# Patient Record
Sex: Female | Born: 1955 | Hispanic: Yes | Smoking: Current every day smoker
Health system: Southern US, Community
[De-identification: ages and names within clinical notes are randomized; demographics above are authoritative.]

## PROBLEM LIST (undated history)

## (undated) DIAGNOSIS — E559 Vitamin D deficiency, unspecified: Secondary | ICD-10-CM

## (undated) DIAGNOSIS — K219 Gastro-esophageal reflux disease without esophagitis: Secondary | ICD-10-CM

## (undated) DIAGNOSIS — E785 Hyperlipidemia, unspecified: Secondary | ICD-10-CM

## (undated) DIAGNOSIS — Z720 Tobacco use: Secondary | ICD-10-CM

## (undated) DIAGNOSIS — E538 Deficiency of other specified B group vitamins: Secondary | ICD-10-CM

## (undated) DIAGNOSIS — E119 Type 2 diabetes mellitus without complications: Secondary | ICD-10-CM

## (undated) DIAGNOSIS — J45909 Unspecified asthma, uncomplicated: Secondary | ICD-10-CM

## (undated) DIAGNOSIS — M810 Age-related osteoporosis without current pathological fracture: Secondary | ICD-10-CM

## (undated) DIAGNOSIS — D510 Vitamin B12 deficiency anemia due to intrinsic factor deficiency: Secondary | ICD-10-CM

## (undated) DIAGNOSIS — E781 Pure hyperglyceridemia: Secondary | ICD-10-CM

## (undated) DIAGNOSIS — G4730 Sleep apnea, unspecified: Secondary | ICD-10-CM

## (undated) DIAGNOSIS — T8859XA Other complications of anesthesia, initial encounter: Secondary | ICD-10-CM

## (undated) HISTORY — DX: Vitamin D deficiency, unspecified: E55.9

## (undated) HISTORY — DX: Hyperlipidemia, unspecified: E78.5

## (undated) HISTORY — DX: Gastro-esophageal reflux disease without esophagitis: K21.9

## (undated) HISTORY — DX: Deficiency of other specified B group vitamins: E53.8

## (undated) HISTORY — DX: Pure hyperglyceridemia: E78.1

## (undated) HISTORY — PX: LEG SURGERY: SHX1003

## (undated) HISTORY — DX: Tobacco use: Z72.0

## (undated) HISTORY — PX: ESOPHAGOGASTRODUODENOSCOPY: SHX1529

## (undated) HISTORY — DX: Age-related osteoporosis without current pathological fracture: M81.0

## (undated) HISTORY — PX: COLONOSCOPY: SHX174

---

## 2018-06-11 ENCOUNTER — Other Ambulatory Visit: Payer: Self-pay

## 2018-06-11 ENCOUNTER — Emergency Department
Admission: EM | Admit: 2018-06-11 | Discharge: 2018-06-11 | Disposition: A | Discharge status: Home / Self Care | Payer: Medicare Other | Attending: Emergency Medicine | Admitting: Emergency Medicine

## 2018-06-11 DIAGNOSIS — K625 Hemorrhage of anus and rectum: Secondary | ICD-10-CM

## 2018-06-11 DIAGNOSIS — Z760 Encounter for issue of repeat prescription: Secondary | ICD-10-CM | POA: Diagnosis not present

## 2018-06-11 LAB — TYPE AND SCREEN
ABO/RH(D): A POS
Antibody Screen: NEGATIVE

## 2018-06-11 LAB — COMPREHENSIVE METABOLIC PANEL
ALBUMIN: 4 g/dL (ref 3.5–5.0)
ALT: 29 U/L (ref 0–44)
AST: 18 U/L (ref 15–41)
Alkaline Phosphatase: 83 U/L (ref 38–126)
Anion gap: 11 (ref 5–15)
BUN: 12 mg/dL (ref 8–23)
CO2: 25 mmol/L (ref 22–32)
Calcium: 8.9 mg/dL (ref 8.9–10.3)
Chloride: 102 mmol/L (ref 98–111)
Creatinine, Ser: 0.85 mg/dL (ref 0.44–1.00)
GFR calc Af Amer: >60 mL/min (ref >60)
GFR calc non Af Amer: >60 mL/min (ref >60)
GLUCOSE: 224 mg/dL — AB (ref 70–99)
Potassium: 4.3 mmol/L (ref 3.5–5.1)
Sodium: 138 mmol/L (ref 135–145)
Total Bilirubin: 0.7 mg/dL (ref 0.3–1.2)
Total Protein: 7.6 g/dL (ref 6.5–8.1)

## 2018-06-11 LAB — CBC
HCT: 44.7 % (ref 36.0–46.0)
Hemoglobin: 14.5 g/dL (ref 12.0–15.0)
MCH: 29.1 pg (ref 26.0–34.0)
MCHC: 32.4 g/dL (ref 30.0–36.0)
MCV: 89.8 fL (ref 80.0–100.0)
Platelets: 320 10*3/uL (ref 150–400)
RBC: 4.98 MIL/uL (ref 3.87–5.11)
RDW: 13.1 % (ref 11.5–15.5)
WBC: 12.5 10*3/uL — ABNORMAL HIGH (ref 4.0–10.5)
nRBC: 0 % (ref 0.0–0.2)

## 2018-06-11 MED ORDER — ALBUTEROL SULFATE HFA 108 (90 BASE) MCG/ACT IN AERS: 2.0000 | INHALATION_SPRAY | Freq: Four times a day (QID) | RESPIRATORY_TRACT | 2 refills | Status: AC | PRN

## 2018-06-11 MED ORDER — METFORMIN HCL 500 MG PO TABS: 500.0000 mg | ORAL_TABLET | Freq: Two times a day (BID) | ORAL | 11 refills | Status: DC

## 2018-06-11 MED ORDER — ESOMEPRAZOLE MAGNESIUM 40 MG PO CPDR: 40.0000 mg | DELAYED_RELEASE_CAPSULE | Freq: Every day | ORAL | 1 refills | Status: DC

## 2018-06-11 MED ORDER — FLUTICASONE PROPIONATE HFA 44 MCG/ACT IN AERO: 2.0000 | INHALATION_SPRAY | Freq: Two times a day (BID) | RESPIRATORY_TRACT | 2 refills | Status: DC

## 2018-06-11 MED ORDER — HYDROCORTISONE ACETATE 25 MG RE SUPP: 25.0000 mg | Freq: Two times a day (BID) | RECTAL | 1 refills | Status: AC

## 2018-06-11 NOTE — ED Notes (Signed)
Pt discharged home after verbalizing understanding of discharge instructions; nad noted. 

## 2018-06-11 NOTE — ED Provider Notes (Signed)
[facility] Emergency Department Provider Note       Time seen: ----------------------------------------- 7:27 AM on 06/11/2018 -----------------------------------------   I have reviewed the triage vital signs and the nursing notes.  HISTORY   Chief Complaint GI Bleeding    HPI Jessica Weiss is a 62 y.o. female with no significant past medical history although she notes a history of diverticulitis who presents to the ED for rectal bleeding for the past 10 days.  Patient reports there is some blood in the toilet as well as when she wipes.  She describes a burning sensation, denies fevers, chills, vomiting, diarrhea or constipation.  No past medical history on file.  There are no active problems to display for this patient.  Allergies Morphine and related and Sulfur  Social History Social History   Tobacco Use  . Smoking status: Not on file  Substance Use Topics  . Alcohol use: Not on file  . Drug use: Not on file   Review of Systems Constitutional: Negative for fever. Cardiovascular: Negative for chest pain. Respiratory: Negative for shortness of breath. Gastrointestinal: Negative for abdominal pain, positive for rectal bleeding Musculoskeletal: Negative for back pain. Skin: Negative for rash. Neurological: Negative for headaches, focal weakness or numbness.  All systems negative/normal/unremarkable except as stated in the HPI  ____________________________________________   PHYSICAL EXAM:  VITAL SIGNS: ED Triage Vitals [06/11/18 0029]  Enc Vitals Group     BP (!) 148/82     Pulse Rate 98     Resp 18     Temp 98.5 F (36.9 C)     Temp Source Oral     SpO2 93 %     Weight 229 lb (103.9 kg)     Height 5\' 6"  (1.676 m)     Head Circumference      Peak Flow      Pain Score 5     Pain Loc      Pain Edu?      Excl. in GC?    Constitutional: Alert and oriented. Well appearing and in no distress. Eyes: Conjunctivae are  normal. Normal extraocular movements. Cardiovascular: Normal rate, regular rhythm. No murmurs, rubs, or gallops. Respiratory: Normal respiratory effort without tachypnea nor retractions. Breath sounds are clear and equal bilaterally. No wheezes/rales/rhonchi. Gastrointestinal: Soft and nontender. Normal bowel sounds Rectal: No gross blood, heme positive stool, no hemorrhoids or mass appreciated Musculoskeletal: Nontender with normal range of motion in extremities. No lower extremity tenderness nor edema. Neurologic:  Normal speech and language. No gross focal neurologic deficits are appreciated.  Skin:  Skin is warm, dry and intact. No rash noted. Psychiatric: Mood and affect are normal. Speech and behavior are normal.   ____________________________________________  ED COURSE:  As part of my medical decision making, I reviewed the following data within the electronic MEDICAL RECORD NUMBER History obtained from family if available, nursing notes, old chart and ekg, as well as notes from prior ED visits. Patient presented for rectal bleeding, we will assess with labs and imaging as indicated at this time.   Procedures ____________________________________________   LABS (pertinent positives/negatives)  Labs Reviewed  COMPREHENSIVE METABOLIC PANEL - Abnormal; Notable for the following components:      Result Value   Glucose, Bld 224 (*)    All other components within normal limits  CBC - Abnormal; Notable for the following components:   WBC 12.5 (*)    All other components within normal limits  POC OCCULT BLOOD, ED  TYPE AND SCREEN   ___________________________________________  DIFFERENTIAL DIAGNOSIS   Hemorrhoids, diverticulitis, diverticulosis, constipation, anal fissure  FINAL ASSESSMENT AND PLAN  Rectal bleeding, medication refill   Plan: The patient had presented for rectal bleeding. Patient's labs did reveal mild hyperglycemia but were otherwise unremarkable.  I will refill  her medications that she has been out of for the last 3 months including her asthma medications, metformin and Nexium.  We will also try Anusol HC Suppository.  She will be referred to GI for outpatient follow-up.   Ulice Dash, MD   Note: This note was generated in part or whole with voice recognition software. Voice recognition is usually quite accurate but there are transcription errors that can and very often do occur. I apologize for any typographical errors that were not detected and corrected.     Emily Filbert, MD 06/11/18 [phone]

## 2018-06-11 NOTE — ED Notes (Signed)
Pt presents after passing bright red blood x 10 days; reports some burning sensation. Denies fever, chills, vomiting, diarrhea, constipation. . Pt has hx of diverticulitis. Pt reports some nausea, states "it is probably my gallbladder." NAD noted.

## 2018-06-11 NOTE — ED Triage Notes (Signed)
Pt states that for the past 10days she has been passing bright red blood, states that she initially thought it was a hemhrroid, states now when she passes gas red blood comes out and tonight when she pushed to urinated bright red blood came from her rectum, pt also states that she has seen mucous and something that appeared to be like flesh

## 2018-06-11 NOTE — ED Notes (Signed)
Dr Mayford Knife at bedside; rectal exam performed.

## 2018-06-11 NOTE — ED Notes (Signed)
Patient resting quietly with eyes closed in no acute distress.  

## 2018-06-11 NOTE — ED Notes (Signed)
Patient to stat desk asking about wait time. Patient given update on wait time. Patient verbalizes understanding.  

## 2018-06-19 ENCOUNTER — Ambulatory Visit
Admission: RE | Admit: 2018-06-19 | Discharge: 2018-06-19 | Disposition: A | Discharge status: Home / Self Care | Payer: Medicare HMO | Source: Ambulatory Visit | Attending: Gastroenterology | Admitting: Gastroenterology

## 2018-06-19 ENCOUNTER — Other Ambulatory Visit: Payer: Self-pay | Admitting: Gastroenterology

## 2018-06-19 DIAGNOSIS — R140 Abdominal distension (gaseous): Secondary | ICD-10-CM | POA: Diagnosis present

## 2018-06-19 DIAGNOSIS — K5730 Diverticulosis of large intestine without perforation or abscess without bleeding: Secondary | ICD-10-CM | POA: Insufficient documentation

## 2018-06-19 DIAGNOSIS — K8020 Calculus of gallbladder without cholecystitis without obstruction: Secondary | ICD-10-CM | POA: Diagnosis not present

## 2018-06-19 DIAGNOSIS — N2889 Other specified disorders of kidney and ureter: Secondary | ICD-10-CM | POA: Insufficient documentation

## 2018-06-19 DIAGNOSIS — R1084 Generalized abdominal pain: Secondary | ICD-10-CM | POA: Diagnosis present

## 2018-06-19 DIAGNOSIS — N1330 Unspecified hydronephrosis: Secondary | ICD-10-CM | POA: Insufficient documentation

## 2018-06-19 DIAGNOSIS — I700 Atherosclerosis of aorta: Secondary | ICD-10-CM | POA: Insufficient documentation

## 2018-06-19 DIAGNOSIS — K760 Fatty (change of) liver, not elsewhere classified: Secondary | ICD-10-CM | POA: Diagnosis not present

## 2018-06-19 DIAGNOSIS — R112 Nausea with vomiting, unspecified: Secondary | ICD-10-CM

## 2018-06-19 DIAGNOSIS — K625 Hemorrhage of anus and rectum: Secondary | ICD-10-CM | POA: Diagnosis not present

## 2018-06-19 HISTORY — DX: Type 2 diabetes mellitus without complications: E11.9

## 2018-06-19 HISTORY — DX: Unspecified asthma, uncomplicated: J45.909

## 2018-06-19 MED ORDER — IOHEXOL 300 MG/ML  SOLN
100.0000 mL | Freq: Once | INTRAMUSCULAR | Status: AC | PRN
  Administered 2018-06-19: 100 mL via INTRAVENOUS

## 2018-07-23 ENCOUNTER — Ambulatory Visit: Payer: Medicare Other | Admitting: Gastroenterology

## 2018-07-23 ENCOUNTER — Encounter

## 2018-08-01 ENCOUNTER — Other Ambulatory Visit: Payer: Self-pay | Admitting: Family Medicine

## 2018-08-01 DIAGNOSIS — R1011 Right upper quadrant pain: Secondary | ICD-10-CM

## 2018-08-06 ENCOUNTER — Ambulatory Visit: Payer: Medicare HMO

## 2018-08-14 ENCOUNTER — Ambulatory Visit: Payer: Medicare HMO

## 2018-08-21 ENCOUNTER — Ambulatory Visit
Admission: RE | Admit: 2018-08-21 | Discharge: 2018-08-21 | Disposition: A | Discharge status: Home / Self Care | Payer: Medicare HMO | Source: Ambulatory Visit | Attending: Family Medicine | Admitting: Family Medicine

## 2018-08-21 ENCOUNTER — Ambulatory Visit: Admission: RE | Admit: 2018-08-21 | Payer: Medicare HMO | Source: Ambulatory Visit

## 2018-08-21 DIAGNOSIS — R1011 Right upper quadrant pain: Secondary | ICD-10-CM | POA: Diagnosis present

## 2019-07-27 ENCOUNTER — Ambulatory Visit: Payer: Self-pay

## 2019-07-27 NOTE — Telephone Encounter (Signed)
Pt called due to exposure Wednesday. Pt stated she was wearing a mask and stayed 10 feet or greater from real estate agent. Pt stated they viewed several houses together. Pt is not having sx. Care advice given and pt instructed to do a 14 day self isolation due to potential transmission. Pt verbalized understanding.  Reason for Disposition . [1] Caller concerned that exposure to COVID-19 occurred BUT [2] does not meet COVID-19 EXPOSURE criteria from [facility]  Answer Assessment - Initial Assessment Questions 1. COVID-19 CLOSE CONTACT: "Who is the person with the confirmed or suspected COVID-19 infection that you were exposed to?"     Real estate 2. PLACE of CONTACT: "Where were you when you were exposed to COVID-19?" (e.g., home, school, medical waiting room; which city?)     Viewing several times 3. TYPE of CONTACT: "How much contact was there?" (e.g., sitting next to, live in same house, work in same office, same building)     10 feet in same room 4. DURATION of CONTACT: "How long were you in contact with the COVID-19 patient?" (e.g., a few seconds, passed by person, a few minutes, 15 minutes or longer, live with the patient)     15 minutes 5. MASK: "Were you wearing a mask?" "Was the other person wearing a mask?" Note: wearing a mask reduces the risk of an  otherwise close contact.    yes 6. DATE of CONTACT: "When did you have contact with a COVID-19 patient?" (e.g., how many days ago)     4 days ago 7. COMMUNITY SPREAD: "Are there lots of cases of COVID-19 (community spread) where you live?" (See public health department website, if unsure)       yes 8. SYMPTOMS: "Do you have any symptoms?" (e.g., fever, cough, breathing difficulty, loss of taste or smell)     no 9. PREGNANCY OR POSTPARTUM: "Is there any chance you are pregnant?" "When was your last menstrual period?" "Did you deliver in the last 2 weeks?"     n/a 10. HIGH RISK: "Do you have any heart or lung problems? Do you have a weak  immune system?" (e.g., heart failure, COPD, asthma, HIV positive, chemotherapy, renal failure, diabetes mellitus, sickle cell anemia, obesity)       Asthma, pre diabetic 11.  TRAVEL: "Have you traveled out of the country recently?" If so, "When and where?"  Also ask about out-of-state travel, since the CDC has identified some high-risk cities for community spread in the [place].  Note: Travel becomes less relevant if there is widespread community transmission where the patient lives.       No/no  Protocols used: CORONAVIRUS (COVID-19) EXPOSURE-A-AH

## 2020-02-05 ENCOUNTER — Encounter (INDEPENDENT_AMBULATORY_CARE_PROVIDER_SITE_OTHER): Payer: Self-pay

## 2020-02-05 ENCOUNTER — Inpatient Hospital Stay: Payer: Medicare Other | Attending: Internal Medicine | Admitting: Internal Medicine

## 2020-02-05 ENCOUNTER — Ambulatory Visit
Admission: RE | Admit: 2020-02-05 | Discharge: 2020-02-05 | Disposition: A | Discharge status: Home / Self Care | Payer: Medicare Other | Source: Ambulatory Visit | Attending: Internal Medicine | Admitting: Internal Medicine

## 2020-02-05 ENCOUNTER — Inpatient Hospital Stay: Payer: Medicare Other

## 2020-02-05 ENCOUNTER — Other Ambulatory Visit: Payer: Self-pay

## 2020-02-05 ENCOUNTER — Other Ambulatory Visit: Payer: Self-pay | Admitting: *Deleted

## 2020-02-05 ENCOUNTER — Encounter: Payer: Self-pay | Admitting: Internal Medicine

## 2020-02-05 VITALS — BP 127/72 | HR 79 | Temp 98.1°F | Resp 20 | Ht 65.0 in | Wt 224.0 lb

## 2020-02-05 DIAGNOSIS — D751 Secondary polycythemia: Secondary | ICD-10-CM | POA: Insufficient documentation

## 2020-02-05 DIAGNOSIS — G4733 Obstructive sleep apnea (adult) (pediatric): Secondary | ICD-10-CM | POA: Insufficient documentation

## 2020-02-05 DIAGNOSIS — D72820 Lymphocytosis (symptomatic): Secondary | ICD-10-CM

## 2020-02-05 DIAGNOSIS — Z79899 Other long term (current) drug therapy: Secondary | ICD-10-CM | POA: Diagnosis not present

## 2020-02-05 DIAGNOSIS — F17210 Nicotine dependence, cigarettes, uncomplicated: Secondary | ICD-10-CM | POA: Diagnosis not present

## 2020-02-05 LAB — COMPREHENSIVE METABOLIC PANEL
ALT: 22 U/L (ref 0–44)
AST: 16 U/L (ref 15–41)
Albumin: 4.3 g/dL (ref 3.5–5.0)
Alkaline Phosphatase: 91 U/L (ref 38–126)
Anion gap: 11 (ref 5–15)
BUN: 10 mg/dL (ref 8–23)
CO2: 25 mmol/L (ref 22–32)
Calcium: 8.8 mg/dL — ABNORMAL LOW (ref 8.9–10.3)
Chloride: 104 mmol/L (ref 98–111)
Creatinine, Ser: 0.7 mg/dL (ref 0.44–1.00)
GFR calc Af Amer: >60 mL/min (ref >60)
GFR calc non Af Amer: >60 mL/min (ref >60)
Glucose, Bld: 147 mg/dL — ABNORMAL HIGH (ref 70–99)
Potassium: 4.5 mmol/L (ref 3.5–5.1)
Sodium: 140 mmol/L (ref 135–145)
Total Bilirubin: 0.6 mg/dL (ref 0.3–1.2)
Total Protein: 7.6 g/dL (ref 6.5–8.1)

## 2020-02-05 LAB — CBC WITH DIFFERENTIAL/PLATELET
Abs Immature Granulocytes: 0.04 10*3/uL (ref 0.00–0.07)
Basophils Absolute: 0.1 10*3/uL (ref 0.0–0.1)
Basophils Relative: 1 %
Eosinophils Absolute: 0.3 10*3/uL (ref 0.0–0.5)
Eosinophils Relative: 2 %
HCT: 48.1 % — ABNORMAL HIGH (ref 36.0–46.0)
Hemoglobin: 15.6 g/dL — ABNORMAL HIGH (ref 12.0–15.0)
Immature Granulocytes: 0 %
Lymphocytes Relative: 38 %
Lymphs Abs: 4.7 10*3/uL — ABNORMAL HIGH (ref 0.7–4.0)
MCH: 28.9 pg (ref 26.0–34.0)
MCHC: 32.4 g/dL (ref 30.0–36.0)
MCV: 89.1 fL (ref 80.0–100.0)
Monocytes Absolute: 0.7 10*3/uL (ref 0.1–1.0)
Monocytes Relative: 5 %
Neutro Abs: 6.9 10*3/uL (ref 1.7–7.7)
Neutrophils Relative %: 54 %
Platelets: 326 10*3/uL (ref 150–400)
RBC: 5.4 MIL/uL — ABNORMAL HIGH (ref 3.87–5.11)
RDW: 13.4 % (ref 11.5–15.5)
WBC: 12.6 10*3/uL — ABNORMAL HIGH (ref 4.0–10.5)
nRBC: 0 % (ref 0.0–0.2)

## 2020-02-05 LAB — TECHNOLOGIST SMEAR REVIEW: Plt Morphology: NORMAL

## 2020-02-05 LAB — LACTATE DEHYDROGENASE: LDH: 131 U/L (ref 98–192)

## 2020-02-05 NOTE — Progress Notes (Signed)
H/T- please inform pt that CXR- was normal. Recommend follow up as planned.GB

## 2020-02-05 NOTE — Assessment & Plan Note (Addendum)
#  Erythrocytosis-hemoglobin 15.5 hematocrit 51/mild absolute lymphocytosis above 5000-slightly progressed over the last 2 years.  #Long discussion the patient regarding potential etiology-benign [obstructive sleep apnea smoking] versus malignant bone marrow problems like polycythemia vera etc. clinically suspicious of a benign process.  #Lymphocytosis mild-reactive versus malignant.  Recommend peripheral blood flow cytometry.  #Obstructive sleep apnea-poorly controlled/not using CPAP.  Recommend compliance  #Check CBC CMP LDH peripheral smear peripheral blood flow cytometry; check to mutation.  Discussed the role of a bone marrow biopsy if patient above work-up is inconclusive or concerning for malignancy.  # smoking/# Lung cancer screening program: We will discuss at next visit.  Thank you, Hilton Cork for allowing me to participate in the care of your pleasant patient. Please do not hesitate to contact me with questions or concerns in the interim.  # DISPOSITION: # labs- today # follow up in 2 weeks-MD; no labs- Dr.B

## 2020-02-05 NOTE — Progress Notes (Signed)
[facility] CONSULT NOTE  Patient Care Team: Donnamarie Rossetti, PA-C as PCP - General (Family Medicine)  CHIEF COMPLAINTS/PURPOSE OF CONSULTATION: Erythrocytosis  #Lymphocytosis [~5000]/ERYTHOCYTOSIS [15.5; HCT-51]-   # DM-type II; obesity; OSA-noncompliant with CPAP; active smoker; history of bipolar Oncology History   No history exists.     HISTORY OF PRESENTING ILLNESS:  Jessica Weiss 64 y.o.  female long-term history of smoking; obstructive sleep apnea not on CPAP; obesity has been referred to [place] for further evaluation recommendations for elevated lymphocytes/hemoglobin.  Patient states her PCP had been monitoring her diabetes closely; adjusting her medications.  Patient had a recent vaginal yeast infection.  However patient also noted to have elevated blood counts which led to further referral to hematology.  Patient states that she was diagnosed with "pernicious anemia" in [place].  She has also been diagnosed with obstructive sleep apnea in [place]; not compliant with her CPAP for the last 2 years.  Complains of worsening fatigue.  Complains of difficulty sleeping at night.     Review of Systems  Constitutional: Positive for malaise/fatigue and weight loss. Negative for chills, diaphoresis and fever.  HENT: Negative for nosebleeds and sore throat.   Eyes: Negative for double vision.  Respiratory: Negative for cough, hemoptysis, sputum production, shortness of breath and wheezing.   Cardiovascular: Negative for chest pain, palpitations, orthopnea and leg swelling.  Gastrointestinal: Positive for diarrhea and heartburn. Negative for abdominal pain, blood in stool, constipation, melena, nausea and vomiting.  Genitourinary: Negative for dysuria, frequency and urgency.  Musculoskeletal: Positive for back pain and joint pain.  Skin: Negative.  Negative for itching and rash.  Neurological: Negative for dizziness, tingling, focal weakness, weakness and  headaches.  Endo/Heme/Allergies: Does not bruise/bleed easily.  Psychiatric/Behavioral: Negative for depression. The patient is not nervous/anxious and does not have insomnia.      MEDICAL HISTORY:  Past Medical History:  Diagnosis Date  . Asthma   . Diabetes mellitus without complication ([place])     SURGICAL HISTORY: History reviewed. No pertinent surgical history.  SOCIAL HISTORY: Social History   Socioeconomic History  . Marital status: Widowed    Spouse name: Not on file  . Number of children: Not on file  . Years of education: Not on file  . Highest education level: Not on file  Occupational History  . Not on file  Tobacco Use  . Smoking status: Not on file  Substance and Sexual Activity  . Alcohol use: Not on file  . Drug use: Not on file  . Sexual activity: Not on file  Other Topics Concern  . Not on file  Social History Narrative   Moved from [place] in 2 years; lives in [place]; lived in [place] 1-2 ppd; no alcohol. 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    Social Determinants of Health   Financial Resource Strain:   . Difficulty of Paying Living Expenses:   Food Insecurity:   . Worried About Charity fundraiser in the Last Year:   . Arboriculturist in the Last Year:   Transportation Needs:   . Film/video editor (Medical):   Marland Kitchen Lack of Transportation (Non-Medical):   Physical Activity:   . Days of Exercise per Week:   . Minutes of Exercise per Session:   Stress:   . Feeling of Stress :   Social Connections:   . Frequency of Communication with Friends and Family:   . Frequency of Social  Gatherings with Friends and Family:   . Attends Religious Services:   . Active Member of Clubs or Organizations:   . Attends Archivist Meetings:   Marland Kitchen Marital Status:   Intimate Partner Violence:   . Fear of Current or Ex-Partner:   . Emotionally Abused:   Marland Kitchen Physically Abused:   . Sexually Abused:     FAMILY HISTORY: History  reviewed. No pertinent family history.  ALLERGIES:  is allergic to morphine and related, metformin, and sulfur.  MEDICATIONS:  Current Outpatient Medications  Medication Sig Dispense Refill  . albuterol (PROVENTIL HFA;VENTOLIN HFA) 108 (90 Base) MCG/ACT inhaler Inhale 2 puffs into the lungs every 6 (six) hours as needed for wheezing or shortness of breath. 1 Inhaler 2  . B Complex Vitamins (B COMPLEX 100 PO) Take 1 tablet by mouth daily.    . Biotin 10 MG CAPS Take 1 capsule by mouth daily.    . Calcium Carbonate-Vit D-Min (CALTRATE 600+D PLUS MINERALS) 600-800 MG-UNIT CHEW Chew 1 tablet by mouth daily.    . cyanocobalamin 1000 MCG tablet Take by mouth.    . empagliflozin (JARDIANCE) 25 MG TABS tablet Take by mouth.    . fenofibrate (TRICOR) 48 MG tablet Take 1 tablet by mouth daily.    Marland Kitchen glimepiride (AMARYL) 4 MG tablet Take by mouth.    . Multiple Vitamin (MULTI-VITAMIN) tablet Take 1 tablet by mouth daily.    . Selenium 200 MCG CAPS Take 1 tablet by mouth daily.    Marland Kitchen atorvastatin (LIPITOR) 40 MG tablet Take 1 tablet by mouth daily.     No current facility-administered medications for this visit.      Marland Kitchen  PHYSICAL EXAMINATION: ECOG PERFORMANCE STATUS: 0 - Asymptomatic  Vitals:   02/05/20 1158  BP: 127/72  Pulse: 79  Resp: 20  Temp: 98.1 F (36.7 C)   Filed Weights   02/05/20 1203  Weight: 224 lb (101.6 kg)    Physical Exam Constitutional:      Comments: Obese.  Alone.  HENT:     Head: Normocephalic and atraumatic.     Mouth/Throat:     Pharynx: No oropharyngeal exudate.  Eyes:     Pupils: Pupils are equal, round, and reactive to light.  Cardiovascular:     Rate and Rhythm: Normal rate and regular rhythm.  Pulmonary:     Effort: Pulmonary effort is normal. No respiratory distress.     Breath sounds: Normal breath sounds. No wheezing.  Abdominal:     General: Bowel sounds are normal. There is no distension.     Palpations: Abdomen is soft. There is no mass.      Tenderness: There is no abdominal tenderness. There is no guarding or rebound.  Musculoskeletal:        General: No tenderness. Normal range of motion.     Cervical back: Normal range of motion and neck supple.  Skin:    General: Skin is warm.  Neurological:     Mental Status: She is alert and oriented to person, place, and time.  Psychiatric:        Mood and Affect: Affect normal.      LABORATORY DATA:  I have reviewed the data as listed Lab Results  Component Value Date   WBC 12.6 (H) 02/05/2020   HGB 15.6 (H) 02/05/2020   HCT 48.1 (H) 02/05/2020   MCV 89.1 02/05/2020   PLT 326 02/05/2020   Recent Labs    02/05/20 1232  NA 140  K 4.5  CL 104  CO2 25  GLUCOSE 147*  BUN 10  CREATININE 0.70  CALCIUM 8.8*  GFRNONAA >60  GFRAA >60  PROT 7.6  ALBUMIN 4.3  AST 16  ALT 22  ALKPHOS 91  BILITOT 0.6    RADIOGRAPHIC STUDIES: I have personally reviewed the radiological images as listed and agreed with the findings in the report. No results found.  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  #Long discussion the patient regarding potential etiology-benign [obstructive sleep apnea smoking] versus malignant bone marrow problems like polycythemia vera etc. clinically suspicious of a benign process.  #Lymphocytosis mild-reactive versus malignant.  Recommend peripheral blood flow cytometry.  #Obstructive sleep apnea-poorly controlled/not using CPAP.  Recommend compliance  #Check CBC CMP LDH peripheral smear peripheral blood flow cytometry; check to mutation.  Discussed the role of a bone marrow biopsy if patient above work-up is inconclusive or concerning for malignancy.  # smoking/# Lung cancer screening program: We will discuss at next visit.  Thank you, Hilton Cork for allowing me to participate in the care of your pleasant patient. Please do  not hesitate to contact me with questions or concerns in the interim.  # DISPOSITION: # labs- today # follow up in 2 weeks-MD; no labs- Dr.B  All questions were answered. The patient knows to call the clinic with any problems, questions or concerns.     Cammie Sickle, MD 02/05/2020 1:04 PM

## 2020-02-06 ENCOUNTER — Telehealth: Payer: Self-pay | Admitting: *Deleted

## 2020-02-06 NOTE — Telephone Encounter (Signed)
-----   Message from Earna Coder, MD sent at 02/05/2020  6:15 PM EDT ----- H/T- please inform pt that CXR- was normal. Recommend follow up as planned.GB

## 2020-02-06 NOTE — Telephone Encounter (Signed)
Spoke with patient. Results of labs and cxr reviewed with the patient. Patient gave verbal understanding of the plan of care. She will follow-up on 7/7 for the remaining results.

## 2020-02-11 LAB — COMP PANEL: LEUKEMIA/LYMPHOMA

## 2020-02-18 LAB — JAK2  V617F QUAL. WITH REFLEX TO EXON 12: Reflex:: 15

## 2020-02-18 LAB — JAK2 EXONS 12-15

## 2020-02-19 ENCOUNTER — Other Ambulatory Visit: Payer: Self-pay

## 2020-02-19 ENCOUNTER — Inpatient Hospital Stay: Payer: Medicare Other

## 2020-02-19 ENCOUNTER — Encounter: Payer: Self-pay | Admitting: Internal Medicine

## 2020-02-19 ENCOUNTER — Inpatient Hospital Stay: Payer: Medicare Other | Attending: Internal Medicine | Admitting: Internal Medicine

## 2020-02-19 VITALS — BP 126/66 | HR 98 | Temp 98.4°F | Resp 18 | Ht 65.0 in | Wt 227.0 lb

## 2020-02-19 DIAGNOSIS — R233 Spontaneous ecchymoses: Secondary | ICD-10-CM | POA: Diagnosis not present

## 2020-02-19 DIAGNOSIS — D571 Sickle-cell disease without crisis: Secondary | ICD-10-CM | POA: Diagnosis present

## 2020-02-19 DIAGNOSIS — R238 Other skin changes: Secondary | ICD-10-CM | POA: Diagnosis not present

## 2020-02-19 DIAGNOSIS — E785 Hyperlipidemia, unspecified: Secondary | ICD-10-CM | POA: Diagnosis not present

## 2020-02-19 DIAGNOSIS — K219 Gastro-esophageal reflux disease without esophagitis: Secondary | ICD-10-CM | POA: Insufficient documentation

## 2020-02-19 DIAGNOSIS — Z8249 Family history of ischemic heart disease and other diseases of the circulatory system: Secondary | ICD-10-CM | POA: Diagnosis not present

## 2020-02-19 DIAGNOSIS — D72820 Lymphocytosis (symptomatic): Secondary | ICD-10-CM

## 2020-02-19 DIAGNOSIS — E119 Type 2 diabetes mellitus without complications: Secondary | ICD-10-CM | POA: Insufficient documentation

## 2020-02-19 DIAGNOSIS — Z7984 Long term (current) use of oral hypoglycemic drugs: Secondary | ICD-10-CM | POA: Insufficient documentation

## 2020-02-19 DIAGNOSIS — D751 Secondary polycythemia: Secondary | ICD-10-CM

## 2020-02-19 DIAGNOSIS — G4733 Obstructive sleep apnea (adult) (pediatric): Secondary | ICD-10-CM | POA: Insufficient documentation

## 2020-02-19 DIAGNOSIS — F17210 Nicotine dependence, cigarettes, uncomplicated: Secondary | ICD-10-CM | POA: Insufficient documentation

## 2020-02-19 DIAGNOSIS — Z79899 Other long term (current) drug therapy: Secondary | ICD-10-CM | POA: Diagnosis not present

## 2020-02-19 DIAGNOSIS — E781 Pure hyperglyceridemia: Secondary | ICD-10-CM | POA: Diagnosis not present

## 2020-02-19 LAB — APTT: aPTT: 29 seconds (ref 24–36)

## 2020-02-19 LAB — PROTIME-INR
INR: 1 (ref 0.8–1.2)
Prothrombin Time: 12.9 seconds (ref 11.4–15.2)

## 2020-02-19 NOTE — Assessment & Plan Note (Addendum)
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  Recommend monitoring for now.  Would not recommend any phlebotomy.   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  We will reorder peripheral blood flow cytometry.  #Easy bruising-unclear etiology patient not on any antiplatelet therapy.  Check PT PTT.   #Obstructive sleep apnea-poorly controlled /not using CPAP.  Recommend compliance  # smoking-counseled the patient to quit smoking.  Patient not interested.  # Lung cancer screening program:  Discussed regarding lung cancer screening program at length; which includes low-dose CT scan on annual basis for 5 years.  Based upon the findings patient would be recommended surveillance/biopsies/or surgery.  Lung cancer program has shown to save lives by early detection of lung cancer.  Declines LCSP.   # DISPOSITION: will call/set up appt.  # labs- today [ordered] # follow up TBD- Dr.B

## 2020-02-19 NOTE — Progress Notes (Signed)
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     HISTORY OF PRESENTING ILLNESS:  Jessica Weiss 64 y.o.  female long-term history of smoking; obstructive sleep apnea not on CPAP; obesity is here today with results of the blood work ordered for elevated lymphocytes/hemoglobin.  Patient continues to have fatigue.  Continues to be noncompliant with her CPAP.  Unfortunately continues to smoke.   Review of Systems  Constitutional: Positive for malaise/fatigue and weight loss. Negative for chills, diaphoresis and fever.  HENT: Negative for nosebleeds and sore throat.   Eyes: Negative for double vision.  Respiratory: Negative for cough, hemoptysis, sputum production, shortness of breath and wheezing.   Cardiovascular: Negative for chest pain, palpitations, orthopnea and leg swelling.  Gastrointestinal: Positive for diarrhea and heartburn. Negative for abdominal pain, blood in stool, constipation, melena, nausea and vomiting.  Genitourinary: Negative for dysuria, frequency and urgency.  Musculoskeletal: Positive for back pain and joint pain.  Skin: Negative.  Negative for itching and rash.  Neurological: Negative for dizziness, tingling, focal weakness, weakness and headaches.  Endo/Heme/Allergies: Does not bruise/bleed easily.  Psychiatric/Behavioral: Negative for depression. The patient is not nervous/anxious and does not have insomnia.      MEDICAL HISTORY:  Past Medical History:  Diagnosis Date  . Age related osteoporosis   . Asthma   . B12 deficiency   .  Diabetes mellitus without complication (HCC)   . GERD (gastroesophageal reflux disease)   . Hyperlipidemia   . Hypertriglyceridemia   . Tobacco abuse   . Vitamin D deficiency     SURGICAL HISTORY: Past Surgical History:  Procedure Laterality Date  . COLONOSCOPY    . ESOPHAGOGASTRODUODENOSCOPY    . LEG SURGERY      SOCIAL HISTORY: Social History   Socioeconomic History  . Marital status: Widowed    Spouse name: Not on file  . Number of children: Not on file  . Years of education: Not on file  . Highest education level: Not on file  Occupational History  . Not on file  Tobacco Use  . Smoking status: Current Every Day Smoker    Packs/day: 3.00    Types: Cigarettes  . Smokeless tobacco: Never Used  Substance and Sexual Activity  . Alcohol use: Yes    Alcohol/week: 1.0 standard drink    Types: 1 Standard drinks or equivalent per week  . Drug use: Never  . Sexual activity: Not on file  Other Topics Concern  . Not on file  Social History Narrative   Moved from [place] in 2 years; lives in [place]; lived in [place]. Smoke 1-2 ppd; no alcohol. 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    Social Determinants of Health   Financial Resource Strain:   . Difficulty of Paying Living Expenses:   Food Insecurity:   . Worried About Programme researcher, broadcasting/film/video in the Last Year:   . Barista in the Last Year:   Transportation Needs:   . Freight forwarder (Medical):   Marland Kitchen Lack of Transportation (Non-Medical):   Physical Activity:   . Days of Exercise per Week:   . Minutes  of Exercise per Session:   Stress:   . Feeling of Stress :   Social Connections:   . Frequency of Communication with Friends and Family:   . Frequency of Social Gatherings with Friends and Family:   . Attends Religious Services:   . Active Member of Clubs or Organizations:   . Attends Banker Meetings:   Marland Kitchen Marital Status:   Intimate Partner Violence:   . Fear of Current or  Ex-Partner:   . Emotionally Abused:   Marland Kitchen Physically Abused:   . Sexually Abused:     FAMILY HISTORY: Family History  Problem Relation Age of Onset  . Cirrhosis Mother   . Heart failure Mother   . Heart failure Brother     ALLERGIES:  is allergic to morphine and related, metformin, and sulfur.  MEDICATIONS:  Current Outpatient Medications  Medication Sig Dispense Refill  . albuterol (PROVENTIL HFA;VENTOLIN HFA) 108 (90 Base) MCG/ACT inhaler Inhale 2 puffs into the lungs every 6 (six) hours as needed for wheezing or shortness of breath. 1 Inhaler 2  . atorvastatin (LIPITOR) 40 MG tablet Take 1 tablet by mouth daily.    . B Complex Vitamins (B COMPLEX 100 PO) Take 1 tablet by mouth daily.    . Biotin 10 MG CAPS Take 1 capsule by mouth daily.    . Calcium Carbonate-Vit D-Min (CALTRATE 600+D PLUS MINERALS) 600-800 MG-UNIT CHEW Chew 1 tablet by mouth daily.    . cyanocobalamin 1000 MCG tablet Take by mouth.    . empagliflozin (JARDIANCE) 25 MG TABS tablet Take by mouth.    . fenofibrate (TRICOR) 48 MG tablet Take 1 tablet by mouth daily.    Marland Kitchen glimepiride (AMARYL) 4 MG tablet Take by mouth.    . Multiple Vitamin (MULTI-VITAMIN) tablet Take 1 tablet by mouth daily.    . Selenium 200 MCG CAPS Take 1 tablet by mouth daily.     No current facility-administered medications for this visit.      Marland Kitchen  PHYSICAL EXAMINATION: ECOG PERFORMANCE STATUS: 0 - Asymptomatic  Vitals:   02/19/20 1406  BP: 126/66  Pulse: 98  Resp: 18  Temp: 98.4 F (36.9 C)  SpO2: 93%   Filed Weights   02/19/20 1406  Weight: 227 lb (103 kg)    Physical Exam Constitutional:      Comments: Obese.  Accompanied by sister.  HENT:     Head: Normocephalic and atraumatic.     Mouth/Throat:     Pharynx: No oropharyngeal exudate.  Eyes:     Pupils: Pupils are equal, round, and reactive to light.  Cardiovascular:     Rate and Rhythm: Normal rate and regular rhythm.  Pulmonary:     Effort: Pulmonary effort is  normal. No respiratory distress.     Breath sounds: Normal breath sounds. No wheezing.  Abdominal:     General: Bowel sounds are normal. There is no distension.     Palpations: Abdomen is soft. There is no mass.     Tenderness: There is no abdominal tenderness. There is no guarding or rebound.  Musculoskeletal:        General: No tenderness. Normal range of motion.     Cervical back: Normal range of motion and neck supple.  Skin:    General: Skin is warm.  Neurological:     Mental Status: She is alert and oriented to person, place, and time.  Psychiatric:        Mood and Affect: Affect normal.  LABORATORY DATA:  I have reviewed the data as listed Lab Results  Component Value Date   WBC 12.6 (H) 02/05/2020   HGB 15.6 (H) 02/05/2020   HCT 48.1 (H) 02/05/2020   MCV 89.1 02/05/2020   PLT 326 02/05/2020   Recent Labs    02/05/20 1232  NA 140  K 4.5  CL 104  CO2 25  GLUCOSE 147*  BUN 10  CREATININE 0.70  CALCIUM 8.8*  GFRNONAA >60  GFRAA >60  PROT 7.6  ALBUMIN 4.3  AST 16  ALT 22  ALKPHOS 91  BILITOT 0.6    RADIOGRAPHIC STUDIES: I have personally reviewed the radiological images as listed and agreed with the findings in the report. DG Chest 2 View  Result Date: 02/05/2020 CLINICAL DATA:  Erythrocytosis. EXAM: CHEST - 2 VIEW COMPARISON:  None. FINDINGS: The heart size and mediastinal contours are within normal limits. Both lungs are clear. The visualized skeletal structures are unremarkable. IMPRESSION: No active cardiopulmonary disease. Electronically Signed   By: Lupita Raider M.D.   On: 02/05/2020 13:34    ASSESSMENT & PLAN:   Erythrocytosis #Erythrocytosis-hemoglobin 15.5 hematocrit 48-JAK2 negative; most likely secondary [see below-smoking/OSA].  Recommend monitoring for now.  Would not recommend any phlebotomy.   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  We will reorder  peripheral blood flow cytometry.  #Easy bruising-unclear etiology patient not on any antiplatelet therapy.  Check PT PTT.   #Obstructive sleep apnea-poorly controlled /not using CPAP.  Recommend compliance  # smoking-counseled the patient to quit smoking.  Patient not interested.  # Lung cancer screening program:  Discussed regarding lung cancer screening program at length; which includes low-dose CT scan on annual basis for 5 years.  Based upon the findings patient would be recommended surveillance/biopsies/or surgery.  Lung cancer program has shown to save lives by early detection of lung cancer.  Declines LCSP.   # DISPOSITION: will call/set up appt.  # labs- today [ordered] # follow up TBD- Dr.B  All questions were answered. The patient knows to call the clinic with any problems, questions or concerns.     Earna Coder, MD 02/19/2020 3:06 PM

## 2020-02-25 LAB — COMP PANEL: LEUKEMIA/LYMPHOMA

## 2020-03-10 ENCOUNTER — Telehealth: Payer: Self-pay | Admitting: Internal Medicine

## 2020-03-10 NOTE — Telephone Encounter (Signed)
On 7/27-left voicemail for the patient that her work-up for elevated white count/hemoglobin-negative for any bone marrow process; mostly reactive.  Recommend follow-up with PCP; and follow-up with [place] only as needed.  FYI

## 2020-08-11 ENCOUNTER — Other Ambulatory Visit: Payer: Self-pay

## 2020-08-11 ENCOUNTER — Ambulatory Visit
Admission: RE | Admit: 2020-08-11 | Discharge: 2020-08-11 | Disposition: A | Discharge status: Home / Self Care | Payer: Medicare Other | Source: Ambulatory Visit | Attending: Family Medicine | Admitting: Family Medicine

## 2020-08-11 ENCOUNTER — Other Ambulatory Visit: Payer: Self-pay | Admitting: Family Medicine

## 2020-08-11 ENCOUNTER — Other Ambulatory Visit (HOSPITAL_COMMUNITY): Payer: Self-pay | Admitting: Family Medicine

## 2020-08-11 ENCOUNTER — Ambulatory Visit: Payer: Medicare Other

## 2020-08-11 DIAGNOSIS — R1011 Right upper quadrant pain: Secondary | ICD-10-CM

## 2021-10-31 ENCOUNTER — Encounter (HOSPITAL_COMMUNITY): Payer: Self-pay | Admitting: *Deleted

## 2021-10-31 ENCOUNTER — Ambulatory Visit (HOSPITAL_COMMUNITY)
Admission: EM | Admit: 2021-10-31 | Discharge: 2021-10-31 | Disposition: A | Discharge status: Home / Self Care | Payer: Medicare Other | Attending: Nurse Practitioner | Admitting: Nurse Practitioner

## 2021-10-31 ENCOUNTER — Other Ambulatory Visit: Payer: Self-pay

## 2021-10-31 DIAGNOSIS — L0231 Cutaneous abscess of buttock: Secondary | ICD-10-CM | POA: Diagnosis not present

## 2021-10-31 MED ORDER — LIDOCAINE-EPINEPHRINE 1 %-1:100000 IJ SOLN
INTRAMUSCULAR | Status: AC
  Filled 2021-10-31: qty 1

## 2021-10-31 MED ORDER — DOXYCYCLINE HYCLATE 100 MG PO CAPS: 100.0000 mg | ORAL_CAPSULE | Freq: Two times a day (BID) | ORAL | 0 refills | Status: AC

## 2021-10-31 NOTE — ED Provider Notes (Signed)
[facility]    CSN: [id number] Arrival date & time: 10/31/21  1447      History   Chief Complaint Chief Complaint  Patient presents with   Abscess    HPI Jessica Weiss is a 66 y.o. female.   The patient is a 66 year old female who presents with an abscess on her left buttocks.  Symptoms have been present for the past 12 days per the patient.  She states that the area has become more swollen, red, and tender.  She has also had increased pain over the past 24 to 48 hours.  She states that it is difficult to sit and she does not know what to do to make it better.  She states that she has tried Epsom salt baths for the past 3 days and symptoms have not improved.  Patient does have a history of type 2 diabetes.  She states her last A1c was 9.  Patient denies fever, chills, abdominal pain, or other systemic symptoms.  She does have a history of recurrent abscesses, states they are usually located under her arms.   Abscess  Past Medical History:  Diagnosis Date   Age related osteoporosis    Asthma    B12 deficiency    Diabetes mellitus without complication (HCC)    GERD (gastroesophageal reflux disease)    Hyperlipidemia    Hypertriglyceridemia    Tobacco abuse    Vitamin D deficiency     Patient Active Problem List   Diagnosis Date Noted   Easy bruising 02/19/2020   Erythrocytosis 02/05/2020    Past Surgical History:  Procedure Laterality Date   COLONOSCOPY     ESOPHAGOGASTRODUODENOSCOPY     LEG SURGERY      OB History   No obstetric history on file.      Home Medications    Prior to Admission medications   Medication Sig Start Date End Date Taking? Authorizing Provider  doxycycline (VIBRAMYCIN) 100 MG capsule Take 1 capsule (100 mg total) by mouth 2 (two) times daily for 10 days. 10/31/21 11/10/21 Yes Leath-Warren, Sadie Haber, NP  albuterol (PROVENTIL HFA;VENTOLIN HFA) 108 (90 Base) MCG/ACT inhaler Inhale 2 puffs into the lungs every 6 (six)  hours as needed for wheezing or shortness of breath. 06/11/18   Emily Filbert, MD  atorvastatin (LIPITOR) 40 MG tablet Take 1 tablet by mouth daily. 10/26/18   [provider]  B Complex Vitamins (B COMPLEX 100 PO) Take 1 tablet by mouth daily.    [provider]  Biotin 10 MG CAPS Take 1 capsule by mouth daily.    [provider]  Calcium Carbonate-Vit D-Min (CALTRATE 600+D PLUS MINERALS) 600-800 MG-UNIT CHEW Chew 1 tablet by mouth daily.    [provider]  cyanocobalamin 1000 MCG tablet Take by mouth.    [provider]  fenofibrate (TRICOR) 48 MG tablet Take 1 tablet by mouth daily. 11/26/19   [provider]  glimepiride (AMARYL) 4 MG tablet Take by mouth. 01/27/20 03/27/20  [provider]  Multiple Vitamin (MULTI-VITAMIN) tablet Take 1 tablet by mouth daily.    [provider]  Selenium 200 MCG CAPS Take 1 tablet by mouth daily.    [provider]    Family History Family History  Problem Relation Age of Onset   Cirrhosis Mother    Heart failure Mother    Heart failure Brother     Social History Social History   Tobacco Use   Smoking  status: Every Day    Packs/day: 3.00    Types: Cigarettes   Smokeless tobacco: Never  Substance Use Topics   Alcohol use: Yes    Alcohol/week: 1.0 standard drink    Types: 1 Standard drinks or equivalent per week   Drug use: Never     Allergies   Morphine and related, Metformin, and Elemental sulfur   Review of Systems Review of Systems  Constitutional: Negative.   Respiratory: Negative.    Cardiovascular: Negative.   Gastrointestinal: Negative.   Skin:  Positive for color change.       Abscess to left buttock  Psychiatric/Behavioral: Negative.      Physical Exam Triage Vital Signs ED Triage Vitals  Enc Vitals Group     BP 10/31/21 1527 135/84     Pulse Rate 10/31/21 1527 (!) 104     Resp 10/31/21 1527 20     Temp 10/31/21 1527 98.4 F  (36.9 C)     Temp src --      SpO2 10/31/21 1527 95 %     Weight --      Height --      Head Circumference --      Peak Flow --      Pain Score 10/31/21 1524 9     Pain Loc --      Pain Edu? --      Excl. in GC? --    No data found.  Updated Vital Signs BP 135/84   Pulse (!) 104   Temp 98.4 F (36.9 C)   Resp 20   SpO2 95%   Visual Acuity Right Eye Distance:   Left Eye Distance:   Bilateral Distance:    Right Eye Near:   Left Eye Near:    Bilateral Near:     Physical Exam Vitals reviewed.  Constitutional:      Appearance: Normal appearance. She is obese.  HENT:     Head: Normocephalic and atraumatic.  Eyes:     Extraocular Movements: Extraocular movements intact.     Conjunctiva/sclera: Conjunctivae normal.     Pupils: Pupils are equal, round, and reactive to light.  Cardiovascular:     Rate and Rhythm: Regular rhythm. Tachycardia present.     Pulses: Normal pulses.     Heart sounds: Normal heart sounds.  Pulmonary:     Effort: Pulmonary effort is normal.     Breath sounds: Normal breath sounds.  Abdominal:     General: Bowel sounds are normal.     Palpations: Abdomen is soft.     Tenderness: There is no abdominal tenderness.  Musculoskeletal:     Cervical back: Normal range of motion.  Skin:    General: Skin is warm and dry.     Findings: Abscess (left buttock) present.     Comments: Abscess noted to left buttock. Area is fluctuant, erythematous and painful to palpation. No drainage noted at present. Measures approximately 3 to 4 cm.   Neurological:     Mental Status: She is alert.     UC Treatments / Results  Labs (all labs ordered are listed, but only abnormal results are displayed) Labs Reviewed - No data to display  EKG   Radiology No results found.  Procedures Incision and Drainage  Date/Time: 10/31/2021 4:12 PM Performed by: Abran Cantor, NP Authorized by: Abran Cantor, NP   Consent:    Consent obtained:   Verbal   Consent given by:  Patient   Risks  discussed:  Bleeding, incomplete drainage, pain and infection   Alternatives discussed:  Delayed treatment Universal protocol:    Procedure explained and questions answered to patient or proxy's satisfaction: yes     Patient identity confirmed:  Verbally with patient and arm band Location:    Type:  Abscess   Size:  3 to 4 cm   Location: left buttock. Pre-procedure details:    Skin preparation:  Povidone-iodine and chlorhexidine Sedation:    Sedation type:  None Anesthesia:    Anesthesia method: Lidocaine 2% with Epi- 8mL. Procedure type:    Complexity:  Simple Procedure details:    Incision depth:  Dermal   Drainage:  Serosanguinous   Drainage amount:  Moderate   Wound treatment:  Wound left open Comments:     Abscess noted to left buttock. Area is fluctuant, tender to palpation. Incision made to abscess, moderate amount of serosanguinous drainage returned. Patient tolerated well. Site cleaned with hibiclens soap. Absorbent dressing applied.  (including critical care time)  Medications Ordered in UC Medications - No data to display  Initial Impression / Assessment and Plan / UC Course  I have reviewed the triage vital signs and the nursing notes.  Pertinent labs & imaging results that were available during my care of the patient were reviewed by me and considered in my medical decision making (see chart for details).  The patient is a 66 year old female who presents with an abscess to her left buttocks.  Symptoms have been present for the past 12 days per the patient report.  Patient presents requesting incision and incision and drainage of the abscess.  The area is erythematous and fluctuant, with tenderness to palpation.  An I&D was performed of the area, with a moderate amount of serosanguineous drainage return.  The patient tolerated the procedure well.  Patient was started on doxycycline for 10 days.  She was encouraged to keep the  area clean and dry, continue Epsom salt soaks and warm compresses, also encouraged the patient to use an antibacterial soap to keep the area clean and to prevent future abscesses.  Patient was encouraged to take medication as prescribed.  Patient to follow-up if she develops fever, chills, abdominal pain, foul-smelling drainage worsening redness or worsening pain.  Patient verbalizes understanding.  All questions answered.  Final Clinical Impressions(s) / UC Diagnoses   Final diagnoses:  Abscess of left buttock     Discharge Instructions      Take medication as prescribed. Continue warm Epsom salt baths at least 2 times daily to help wound recovery. Clean the affected area twice daily with Dial Gold bar soap.  This is an antibacterial soap and will help expedite healing. You may have to purchase briefs or wear a maxi pad to the affected area to help with drainage.  The area will continue to drain for the next several days. Follow-up if you develop fever, chills, abdominal pain, foul-smelling drainage, or other concerns. Follow-up with your primary care as scheduled.     ED Prescriptions     Medication Sig Dispense Auth. Provider   doxycycline (VIBRAMYCIN) 100 MG capsule Take 1 capsule (100 mg total) by mouth 2 (two) times daily for 10 days. 20 capsule Leath-Warren, Sadie Haber, NP      PDMP not reviewed this encounter.   Abran Cantor, NP 10/31/21 1622

## 2021-10-31 NOTE — Discharge Instructions (Addendum)
Take medication as prescribed. ?Continue warm Epsom salt baths at least 2 times daily to help wound recovery. ?Clean the affected area twice daily with Dial Gold bar soap.  This is an antibacterial soap and will help expedite healing. ?You may have to purchase briefs or wear a maxi pad to the affected area to help with drainage.  The area will continue to drain for the next several days. ?Follow-up if you develop fever, chills, abdominal pain, foul-smelling drainage, or other concerns. ?Follow-up with your primary care as scheduled. ?

## 2021-10-31 NOTE — ED Triage Notes (Signed)
Pt reports abscess and pain between cheeks of buttocks. ?

## 2021-11-25 ENCOUNTER — Other Ambulatory Visit: Payer: Self-pay | Admitting: Internal Medicine

## 2021-11-25 DIAGNOSIS — N1330 Unspecified hydronephrosis: Secondary | ICD-10-CM

## 2021-11-30 ENCOUNTER — Other Ambulatory Visit: Payer: Self-pay | Admitting: Internal Medicine

## 2021-11-30 ENCOUNTER — Ambulatory Visit
Admission: RE | Admit: 2021-11-30 | Discharge: 2021-11-30 | Disposition: A | Discharge status: Home / Self Care | Payer: Medicare Other | Source: Ambulatory Visit | Attending: Internal Medicine | Admitting: Internal Medicine

## 2021-11-30 DIAGNOSIS — N1330 Unspecified hydronephrosis: Secondary | ICD-10-CM

## 2021-11-30 DIAGNOSIS — G8929 Other chronic pain: Secondary | ICD-10-CM

## 2021-12-01 ENCOUNTER — Encounter (HOSPITAL_BASED_OUTPATIENT_CLINIC_OR_DEPARTMENT_OTHER): Payer: Self-pay

## 2021-12-01 DIAGNOSIS — R519 Headache, unspecified: Secondary | ICD-10-CM

## 2021-12-23 ENCOUNTER — Other Ambulatory Visit: Payer: Self-pay

## 2021-12-23 ENCOUNTER — Ambulatory Visit (HOSPITAL_BASED_OUTPATIENT_CLINIC_OR_DEPARTMENT_OTHER): Payer: Medicare Other | Attending: Internal Medicine | Admitting: Internal Medicine

## 2021-12-23 DIAGNOSIS — G4733 Obstructive sleep apnea (adult) (pediatric): Secondary | ICD-10-CM | POA: Diagnosis present

## 2021-12-23 DIAGNOSIS — I493 Ventricular premature depolarization: Secondary | ICD-10-CM | POA: Diagnosis not present

## 2021-12-23 DIAGNOSIS — R519 Headache, unspecified: Secondary | ICD-10-CM | POA: Insufficient documentation

## 2021-12-26 DIAGNOSIS — G4736 Sleep related hypoventilation in conditions classified elsewhere: Secondary | ICD-10-CM

## 2021-12-26 DIAGNOSIS — R519 Headache, unspecified: Secondary | ICD-10-CM | POA: Diagnosis not present

## 2021-12-26 DIAGNOSIS — G4733 Obstructive sleep apnea (adult) (pediatric): Secondary | ICD-10-CM

## 2021-12-26 NOTE — Procedures (Signed)
? ? ? ?  Patient Name: Jessica Weiss, Jessica Weiss ?Study Date: 12/23/2021 ?Gender: Female ?D.O.B: 08-May-1956 ?Age (years): 39 ?Referring Provider: Worthy Rancher ?Height (inches): 66 ?Interpreting Physician: Jetty Duhamel MD, ABSM ?Weight (lbs): 224 ?RPSGT: Lowry Ram ?BMI: 36 ?MRN: [id number] ?Neck Size: 15.00 ? ?CLINICAL INFORMATION ?Sleep Study Type: NPSG ?Indication for sleep study: Morning Headaches, Obesity, Snoring ?Epworth Sleepiness Score: 6 ? ?SLEEP STUDY TECHNIQUE ?As per the AASM Manual for the Scoring of Sleep and Associated Events v2.3 (April 2016) with a hypopnea requiring 4% desaturations. ? ?The channels recorded and monitored were frontal, central and occipital EEG, electrooculogram (EOG), submentalis EMG (chin), nasal and oral airflow, thoracic and abdominal wall motion, anterior tibialis EMG, snore microphone, electrocardiogram, and pulse oximetry. ? ?MEDICATIONS ?Medications self-administered by patient taken the night of the study : N/A ? ?SLEEP ARCHITECTURE ?The study was initiated at 10:06:15 PM and ended at 4:14:03 AM. ? ?Sleep onset time was 31.7 minutes and the sleep efficiency was 19.4%%. The total sleep time was 71.5 minutes. ? ?Stage REM latency was 57.5 minutes. ? ?The patient spent 6.3%% of the night in stage N1 sleep, 28.0%% in stage N2 sleep, 51.0%% in stage N3 and 14.7% in REM. ? ?Alpha intrusion was absent. ? ?Supine sleep was 0.00%. ? ?RESPIRATORY PARAMETERS ?The overall apnea/hypopnea index (AHI) was 5.9 per hour. There were 0 total apneas, including 0 obstructive, 0 central and 0 mixed apneas. There were 7 hypopneas and 0 RERAs. ? ?The AHI during Stage REM sleep was 40.0 per hour. ? ?AHI while supine was N/A per hour. ? ?The mean oxygen saturation was 90.0%. The minimum SpO2 during sleep was 86.0%. ? ?soft snoring was noted during this study. ? ?CARDIAC DATA ?The 2 lead EKG demonstrated sinus rhythm. The mean heart rate was 79.8 beats per minute. Other EKG findings include:  PVCs. ? ?LEG MOVEMENT DATA ?The total PLMS were 0 with a resulting PLMS index of 0.0. Associated arousal with leg movement index was 0.0 . ? ?IMPRESSIONS ?- Insufficient sleep time (71.5 minutes) for valid assessment. Patient described as anxious, with no sleep after midnight. Consider sleep medication if returns. ?- Mild obstructive sleep apnea occurred during this study (AHI = 5.9/h). ?- Mild oxygen desaturation was noted during this study (Min O2 = 86.0%). Mean 90% ?- The patient snored with soft snoring volume. ?- EKG findings include frequent PVCs. ?- Clinically significant periodic limb movements did not occur during sleep. No significant associated arousals. ? ?DIAGNOSIS ?- Obstructive Sleep Apnea (G47.33) ?- Nocturnal Hypoxemia (G47.36) ? ?RECOMMENDATIONS ?- Consider return for reassessment, or schedule home sleep test. Suggest sleep aid for next study. ?- Sleep hygiene should be reviewed to assess factors that may improve sleep quality. ?- Weight management and regular exercise should be initiated or continued if appropriate. ? ?[Electronically signed] 12/26/2021 11:50 AM ? ?Jetty Duhamel MD, ABSM ?Diplomate, Biomedical engineer of Sleep Medicine ?NPI: [id number] ?  ? ? ? ? ? ? ? ? ? ? ? ? ? ? ? ? ? ? ? ? ? ?Jesusmanuel Erbes ?Diplomate, Biomedical engineer of Sleep Medicine ? ?ELECTRONICALLY SIGNED ON:  12/26/2021, 11:42 AM ?[facility] ?PH: [phone]   FX: [phone] ?ACCREDITED BY THE AMERICAN ACADEMY OF SLEEP MEDICINE ?

## 2021-12-27 ENCOUNTER — Other Ambulatory Visit: Payer: Self-pay | Admitting: Internal Medicine

## 2021-12-27 DIAGNOSIS — Z1231 Encounter for screening mammogram for malignant neoplasm of breast: Secondary | ICD-10-CM

## 2022-01-06 ENCOUNTER — Ambulatory Visit: Payer: Medicare Other

## 2022-01-25 ENCOUNTER — Ambulatory Visit: Payer: Medicare Other

## 2022-02-17 ENCOUNTER — Ambulatory Visit
Admission: RE | Admit: 2022-02-17 | Discharge: 2022-02-17 | Disposition: A | Discharge status: Home / Self Care | Payer: Medicare Other | Source: Ambulatory Visit | Attending: Internal Medicine | Admitting: Internal Medicine

## 2022-02-17 DIAGNOSIS — Z1231 Encounter for screening mammogram for malignant neoplasm of breast: Secondary | ICD-10-CM

## 2022-03-24 ENCOUNTER — Other Ambulatory Visit: Payer: Self-pay | Admitting: *Deleted

## 2022-03-24 DIAGNOSIS — J4520 Mild intermittent asthma, uncomplicated: Secondary | ICD-10-CM

## 2022-03-25 ENCOUNTER — Ambulatory Visit (INDEPENDENT_AMBULATORY_CARE_PROVIDER_SITE_OTHER): Payer: Medicare Other | Admitting: Internal Medicine

## 2022-03-25 DIAGNOSIS — J4520 Mild intermittent asthma, uncomplicated: Secondary | ICD-10-CM

## 2022-03-25 LAB — PULMONARY FUNCTION TEST
DL/VA % pred: 125 %
DL/VA: 5.24 ml/min/mmHg/L
DLCO cor % pred: 88 %
DLCO cor: 17.5 ml/min/mmHg
DLCO unc % pred: 88 %
DLCO unc: 17.5 ml/min/mmHg
FEF 25-75 Post: 2.04 L/sec
FEF 25-75 Pre: 1.75 L/sec
FEF2575-%Change-Post: 16 %
FEF2575-%Pred-Post: 95 %
FEF2575-%Pred-Pre: 82 %
FEV1-%Change-Post: 3 %
FEV1-%Pred-Post: 62 %
FEV1-%Pred-Pre: 60 %
FEV1-Post: 1.51 L
FEV1-Pre: 1.46 L
FEV1FVC-%Change-Post: 0 %
FEV1FVC-%Pred-Pre: 110 %
FEV6-%Change-Post: 4 %
FEV6-%Pred-Post: 58 %
FEV6-%Pred-Pre: 56 %
FEV6-Post: 1.78 L
FEV6-Pre: 1.71 L
FEV6FVC-%Pred-Post: 104 %
FEV6FVC-%Pred-Pre: 104 %
FVC-%Change-Post: 4 %
FVC-%Pred-Post: 56 %
FVC-%Pred-Pre: 54 %
FVC-Post: 1.78 L
FVC-Pre: 1.71 L
Post FEV1/FVC ratio: 85 %
Post FEV6/FVC ratio: 100 %
Pre FEV1/FVC ratio: 85 %
Pre FEV6/FVC Ratio: 100 %
RV % pred: 108 %
RV: 2.27 L
TLC % pred: 83 %
TLC: 4.22 L

## 2022-03-25 NOTE — Progress Notes (Signed)
Full PFT Performed Today  

## 2022-03-25 NOTE — Patient Instructions (Signed)
Full PFT Completed Today 

## 2022-05-03 ENCOUNTER — Other Ambulatory Visit: Payer: Self-pay | Admitting: Infectious Diseases

## 2022-05-03 DIAGNOSIS — K8020 Calculus of gallbladder without cholecystitis without obstruction: Secondary | ICD-10-CM

## 2022-05-10 ENCOUNTER — Ambulatory Visit
Admission: RE | Admit: 2022-05-10 | Discharge: 2022-05-10 | Disposition: A | Discharge status: Home / Self Care | Payer: Medicare Other | Source: Ambulatory Visit | Attending: Infectious Diseases | Admitting: Infectious Diseases

## 2022-05-10 DIAGNOSIS — K8020 Calculus of gallbladder without cholecystitis without obstruction: Secondary | ICD-10-CM

## 2022-05-18 DIAGNOSIS — K8020 Calculus of gallbladder without cholecystitis without obstruction: Secondary | ICD-10-CM | POA: Diagnosis not present

## 2022-05-18 DIAGNOSIS — Z0289 Encounter for other administrative examinations: Secondary | ICD-10-CM | POA: Diagnosis not present

## 2022-05-18 DIAGNOSIS — N3000 Acute cystitis without hematuria: Secondary | ICD-10-CM | POA: Diagnosis not present

## 2022-05-30 ENCOUNTER — Ambulatory Visit: Payer: Self-pay | Admitting: Surgery

## 2022-05-30 DIAGNOSIS — K8020 Calculus of gallbladder without cholecystitis without obstruction: Secondary | ICD-10-CM | POA: Diagnosis not present

## 2022-05-30 NOTE — H&P (Signed)
History of Present Illness: Jessica Weiss is a 66 y.o. female who was referred to me for evaluation of gallstones.  She has been having RUQ pain intermittently for several years, but it has recently gotten worse. It is exacerbated by eating and she has been avoiding foods that worsen her symptoms. A RUQ [place] on 9/26 showed a large 2.3cm gallstone. She previously had an [place] in December 2021 and had a large stone at that time as well.   The patient has not had any previous abdominal surgeries. She did have an extensive reconstructive surgery on her leg about 10 years ago in [place], and per her report, during that surgery she got "agitated" with anesthesia and "my heart stopped." This is all the information she has about this event and says she was never diagnosed with any cardiac issues. She does have a history of sleep apnea but is not currently using a CPAP. She follows with a pulmonologist (Dr. Annamaria Boots).     Review of Systems: A complete review of systems was obtained from the patient.  I have reviewed this information and discussed as appropriate with the patient.  See HPI as well for other ROS.     Medical History: Past Medical HistoryExpand by Default Past Medical History: Diagnosis Date  Adult idiopathic generalized osteoporosis    Anemia    Asthma, unspecified asthma severity, unspecified whether complicated, unspecified whether persistent    Diabetes mellitus without complication (CMS-HCC)    Diverticulitis    GERD (gastroesophageal reflux disease)    Hyperlipemia    Irritable bowel syndrome    Osteoporosis    Sleep apnea        Patient Active Problem List Diagnosis  Gastroesophageal reflux disease  Type 2 diabetes mellitus with hyperlipidemia   Age related osteoporosis  B12 deficiency  Vitamin D deficiency  Vertigo  Gall stones  Hypertriglyceridemia on fenofibrate  Hyperlipidemia  Tobacco abuse   Moderate persistent asthma without complication  Severe obesity  (BMI 35.0-39.9) with comorbidity (CMS-HCC)     Past Surgical History Past Surgical History: Procedure Laterality Date  leg surgery Right 12/2013   metal plates - lower leg  COLONOSCOPY   06/25/2018   Tubular adenoma of the colon/Repeat 72yr/TKT  EGD   06/25/2018   Negative EGD biopsy/No Repeat/TKT      Allergies Allergies Allergen Reactions  Morphine Anaphylaxis  Metformin Diarrhea  Sulfa (Sulfonamide Antibiotics) Rash  Sulfur (Not Sulfa) Rash      Current Outpatient Medications on File Prior to Visit Medication Sig Dispense Refill  albuterol (ACCUNEB) 1.25 mg/3 mL nebulizer solution USE 1 VIAL VIA NEBULIZER EVERY 6 HOURS AS NEEDED FOR WHEEZING 75 mL 12  albuterol 90 mcg/actuation inhaler INHALE 2 PUFFS INTO THE LUNGS EVERY 6 HOURS AS NEEDED 18 Inhaler 2  atorvastatin (LIPITOR) 40 MG tablet Take 1 tablet (40 mg total) by mouth once daily 30 tablet 11  blood glucose diagnostic test strip Use 1 each (1 strip total) 2 (two) times daily diagnosis code: E11.9   One touch verio 100 each 12  blood glucose meter kit Use as directed 1 each 0  cetirizine (ZYRTEC) 10 MG tablet TAKE 1 TABLET BY MOUTH EVERY DAY 90 tablet 1  DULoxetine (CYMBALTA) 30 MG DR capsule Take 1 capsule (30 mg total) by mouth once daily 30 capsule 11  fenofibrate nanocrystallized (TRICOR) 48 MG tablet TAKE 1 TABLET BY MOUTH EVERY DAY 90 tablet 3  FLOVENT HFA 44 mcg/actuation inhaler INHALE 1 INHALATION INTO THE LUNGS  2 TIMES A DAY 10.6 Inhaler 1  glimepiride (AMARYL) 4 MG tablet TAKE 1 TABLET (4 MG TOTAL) BY MOUTH 2 (TWO) TIMES DAILY 180 tablet 1  insulin GLARGINE (LANTUS) injection (concentration 100 units/mL) Inject 10 Units subcutaneously nightly      JARDIANCE 25 mg tablet TAKE 1 TABLET BY MOUTH EVERY DAY WITH BREAKFAST 30 tablet 0  montelukast (SINGULAIR) 10 mg tablet Take 10 mg by mouth nightly      pen needle, diabetic 31 gauge x 5/16" needle Use as directed 30 each 12   No current facility-administered  medications on file prior to visit.     Family History Family History Problem Relation Age of Onset  Cirrhosis Mother    Heart failure Mother    Diabetes Sister    Diabetes Brother    Heart failure Brother    Diabetes type II Paternal Grandfather        Social History   Tobacco Use Smoking Status Every Day  Types: Cigarettes Smokeless Tobacco Never Tobacco Comments   3 a day - taking chantix     Social History Social History    Socioeconomic History  Marital status: Widowed Tobacco Use  Smoking status: Every Day     Types: Cigarettes  Smokeless tobacco: Never  Tobacco comments:     3 a day - taking chantix Vaping Use  Vaping Use: Never used Substance and Sexual Activity  Alcohol use: Yes  Drug use: Never  Sexual activity: Not Currently      Objective:     Vitals:   05/30/22 1412 BP: 124/80 Pulse: 80 Temp: 36.7 C (98.1 F) SpO2: 95% Weight: (!) 105.6 kg (232 lb 12.8 oz) Height: 167.6 cm (_0 )   Body mass index is 37.57 kg/m.   Physical Exam Vitals reviewed.  Constitutional:      General: She is not in acute distress.    Appearance: Normal appearance.  HENT:     Head: Normocephalic and atraumatic.  Eyes:     General: No scleral icterus.    Conjunctiva/sclera: Conjunctivae normal.  Cardiovascular:     Rate and Rhythm: Normal rate and regular rhythm.     Heart sounds: No murmur heard. Pulmonary:     Effort: Pulmonary effort is normal. No respiratory distress.     Breath sounds: Normal breath sounds. No wheezing.  Abdominal:     General: There is no distension.     Palpations: Abdomen is soft.     Comments: Mildly tender in RUQ and right flank.  Musculoskeletal:        General: Normal range of motion.  Skin:    General: Skin is warm and dry.  Neurological:     General: No focal deficit present.     Mental Status: She is alert and oriented to person, place, and time.  Psychiatric:        Mood and Affect: Mood normal.         Behavior: Behavior normal.        Thought Content: Thought content normal.            Assessment and Plan: Diagnoses and all orders for this visit:   Calculus of gallbladder without cholecystitis without obstruction    This is a 66 yo female presenting with RUQ abdominal pain. I personally reviewed her imaging, including prior ultrasounds and CT scan. She has a large stone in the fundus of the gallbladder, and symptoms are consistent with biliary colic. Laparoscopic cholecystectomy was recommended. The details  of this procedure were discussed with the patient, including the risks of bleeding, infection, bile leak, and <0.5% risk of common bile duct injury. The patient expressed understanding and agrees to proceed with surgery. I have requested a copy of her previous anesthesia records from her surgery in [place] to determine if the episode she is referring to was a true cardiac arrest during anesthesia and what the etiology of this was. If so, she may need further medical evaluation prior to scheduling for surgery and general anesthesia. She will be scheduled for surgery once I have received and reviewed these records.  Michaelle Birks, MD [facility] General, Hepatobiliary and Pancreatic Surgery 05/30/22 5:16 PM  Addendum 05/31/22: Records from [facility] in [place] received and reviewed. Patient had ORIF of a tibia fracture in May 2015, and per the records desatted to 90% intra-op and remained intubated for a brief period postop. She recovered and was discharged to rehab. There is no mention of cardiac arrest at any point. Will proceed with scheduling patient for surgery.

## 2022-05-30 NOTE — H&P (View-Only) (Signed)
History of Present Illness: Jessica Weiss is a 66 y.o. female who was referred to me for evaluation of gallstones.  She has been having RUQ pain intermittently for several years, but it has recently gotten worse. It is exacerbated by eating and she has been avoiding foods that worsen her symptoms. A RUQ [place] on 9/26 showed a large 2.3cm gallstone. She previously had an [place] in December 2021 and had a large stone at that time as well.   The patient has not had any previous abdominal surgeries. She did have an extensive reconstructive surgery on her leg about 10 years ago in [place], and per her report, during that surgery she got "agitated" with anesthesia and "my heart stopped." This is all the information she has about this event and says she was never diagnosed with any cardiac issues. She does have a history of sleep apnea but is not currently using a CPAP. She follows with a pulmonologist (Dr. Annamaria Boots).     Review of Systems: A complete review of systems was obtained from the patient.  I have reviewed this information and discussed as appropriate with the patient.  See HPI as well for other ROS.     Medical History: Past Medical HistoryExpand by Default Past Medical History: Diagnosis Date  Adult idiopathic generalized osteoporosis    Anemia    Asthma, unspecified asthma severity, unspecified whether complicated, unspecified whether persistent    Diabetes mellitus without complication (CMS-HCC)    Diverticulitis    GERD (gastroesophageal reflux disease)    Hyperlipemia    Irritable bowel syndrome    Osteoporosis    Sleep apnea        Patient Active Problem List Diagnosis  Gastroesophageal reflux disease  Type 2 diabetes mellitus with hyperlipidemia   Age related osteoporosis  B12 deficiency  Vitamin D deficiency  Vertigo  Gall stones  Hypertriglyceridemia on fenofibrate  Hyperlipidemia  Tobacco abuse   Moderate persistent asthma without complication  Severe obesity  (BMI 35.0-39.9) with comorbidity (CMS-HCC)     Past Surgical History Past Surgical History: Procedure Laterality Date  leg surgery Right 12/2013   metal plates - lower leg  COLONOSCOPY   06/25/2018   Tubular adenoma of the colon/Repeat 72yr/TKT  EGD   06/25/2018   Negative EGD biopsy/No Repeat/TKT      Allergies Allergies Allergen Reactions  Morphine Anaphylaxis  Metformin Diarrhea  Sulfa (Sulfonamide Antibiotics) Rash  Sulfur (Not Sulfa) Rash      Current Outpatient Medications on File Prior to Visit Medication Sig Dispense Refill  albuterol (ACCUNEB) 1.25 mg/3 mL nebulizer solution USE 1 VIAL VIA NEBULIZER EVERY 6 HOURS AS NEEDED FOR WHEEZING 75 mL 12  albuterol 90 mcg/actuation inhaler INHALE 2 PUFFS INTO THE LUNGS EVERY 6 HOURS AS NEEDED 18 Inhaler 2  atorvastatin (LIPITOR) 40 MG tablet Take 1 tablet (40 mg total) by mouth once daily 30 tablet 11  blood glucose diagnostic test strip Use 1 each (1 strip total) 2 (two) times daily diagnosis code: E11.9   One touch verio 100 each 12  blood glucose meter kit Use as directed 1 each 0  cetirizine (ZYRTEC) 10 MG tablet TAKE 1 TABLET BY MOUTH EVERY DAY 90 tablet 1  DULoxetine (CYMBALTA) 30 MG DR capsule Take 1 capsule (30 mg total) by mouth once daily 30 capsule 11  fenofibrate nanocrystallized (TRICOR) 48 MG tablet TAKE 1 TABLET BY MOUTH EVERY DAY 90 tablet 3  FLOVENT HFA 44 mcg/actuation inhaler INHALE 1 INHALATION INTO THE LUNGS  2 TIMES A DAY 10.6 Inhaler 1  glimepiride (AMARYL) 4 MG tablet TAKE 1 TABLET (4 MG TOTAL) BY MOUTH 2 (TWO) TIMES DAILY 180 tablet 1  insulin GLARGINE (LANTUS) injection (concentration 100 units/mL) Inject 10 Units subcutaneously nightly      JARDIANCE 25 mg tablet TAKE 1 TABLET BY MOUTH EVERY DAY WITH BREAKFAST 30 tablet 0  montelukast (SINGULAIR) 10 mg tablet Take 10 mg by mouth nightly      pen needle, diabetic 31 gauge x 5/16" needle Use as directed 30 each 12   No current facility-administered  medications on file prior to visit.     Family History Family History Problem Relation Age of Onset  Cirrhosis Mother    Heart failure Mother    Diabetes Sister    Diabetes Brother    Heart failure Brother    Diabetes type II Paternal Grandfather        Social History   Tobacco Use Smoking Status Every Day  Types: Cigarettes Smokeless Tobacco Never Tobacco Comments   3 a day - taking chantix     Social History Social History    Socioeconomic History  Marital status: Widowed Tobacco Use  Smoking status: Every Day     Types: Cigarettes  Smokeless tobacco: Never  Tobacco comments:     3 a day - taking chantix Vaping Use  Vaping Use: Never used Substance and Sexual Activity  Alcohol use: Yes  Drug use: Never  Sexual activity: Not Currently      Objective:     Vitals:   05/30/22 1412 BP: 124/80 Pulse: 80 Temp: 36.7 C (98.1 F) SpO2: 95% Weight: (!) 105.6 kg (232 lb 12.8 oz) Height: 167.6 cm (_0 )   Body mass index is 37.57 kg/m.   Physical Exam Vitals reviewed.  Constitutional:      General: She is not in acute distress.    Appearance: Normal appearance.  HENT:     Head: Normocephalic and atraumatic.  Eyes:     General: No scleral icterus.    Conjunctiva/sclera: Conjunctivae normal.  Cardiovascular:     Rate and Rhythm: Normal rate and regular rhythm.     Heart sounds: No murmur heard. Pulmonary:     Effort: Pulmonary effort is normal. No respiratory distress.     Breath sounds: Normal breath sounds. No wheezing.  Abdominal:     General: There is no distension.     Palpations: Abdomen is soft.     Comments: Mildly tender in RUQ and right flank.  Musculoskeletal:        General: Normal range of motion.  Skin:    General: Skin is warm and dry.  Neurological:     General: No focal deficit present.     Mental Status: She is alert and oriented to person, place, and time.  Psychiatric:        Mood and Affect: Mood normal.         Behavior: Behavior normal.        Thought Content: Thought content normal.            Assessment and Plan: Diagnoses and all orders for this visit:   Calculus of gallbladder without cholecystitis without obstruction    This is a 66 yo female presenting with RUQ abdominal pain. I personally reviewed her imaging, including prior ultrasounds and CT scan. She has a large stone in the fundus of the gallbladder, and symptoms are consistent with biliary colic. Laparoscopic cholecystectomy was recommended. The details  of this procedure were discussed with the patient, including the risks of bleeding, infection, bile leak, and <0.5% risk of common bile duct injury. The patient expressed understanding and agrees to proceed with surgery. I have requested a copy of her previous anesthesia records from her surgery in [place] to determine if the episode she is referring to was a true cardiac arrest during anesthesia and what the etiology of this was. If so, she may need further medical evaluation prior to scheduling for surgery and general anesthesia. She will be scheduled for surgery once I have received and reviewed these records.  Michaelle Birks, MD [facility] General, Hepatobiliary and Pancreatic Surgery 05/30/22 5:16 PM  Addendum 05/31/22: Records from [facility] in [place] received and reviewed. Patient had ORIF of a tibia fracture in May 2015, and per the records desatted to 90% intra-op and remained intubated for a brief period postop. She recovered and was discharged to rehab. There is no mention of cardiac arrest at any point. Will proceed with scheduling patient for surgery.

## 2022-06-01 DIAGNOSIS — N3000 Acute cystitis without hematuria: Secondary | ICD-10-CM | POA: Diagnosis not present

## 2022-06-01 DIAGNOSIS — K8020 Calculus of gallbladder without cholecystitis without obstruction: Secondary | ICD-10-CM | POA: Diagnosis not present

## 2022-06-01 DIAGNOSIS — Z0289 Encounter for other administrative examinations: Secondary | ICD-10-CM | POA: Diagnosis not present

## 2022-06-08 ENCOUNTER — Encounter (HOSPITAL_COMMUNITY): Payer: Self-pay | Admitting: Surgery

## 2022-06-08 ENCOUNTER — Other Ambulatory Visit: Payer: Self-pay

## 2022-06-08 NOTE — Progress Notes (Signed)
PCP - Dr. Colin Ina- [facility]  Cardiologist - Denies  EP- Denies  Endocrine- Denies  Pulm- Denies  Chest x-ray - Denies  EKG - 06/09/22- Day of surgery  Stress Test - Denies  ECHO - Denies  Cardiac Cath - Denies  AICD-na PM-na LOOP-na  Nerve Stimulator- Denies  Dialysis- Denies  Sleep Study - Yes- Positive CPAP - Denies  LABS- 06/09/22: CBC, BMP  ASA- Denies  ERAS- No  HA1C- 12/16/20(CE): 8.7, pt states most recent is 8.0 Fasting Blood Sugar - 136-337 Checks Blood Sugar ___1__ time a day  Anesthesia- Yes- A1C  Pt denies having chest pain, sob, or fever during the pre-op phone call. All instructions explained to the pt, with a verbal understanding of the material. Pt also instructed to wear a mask and social distance if she goes out. The opportunity to ask questions was provided.

## 2022-06-08 NOTE — Anesthesia Preprocedure Evaluation (Signed)
Anesthesia Evaluation  Patient identified by MRN, date of birth, ID band Patient awake    Reviewed: Allergy & Precautions, NPO status , Patient's Chart, lab work & pertinent test results  Airway Mallampati: II  TM Distance: >3 FB Neck ROM: Full    Dental  (+) Dental Advisory Given, Missing, Poor Dentition   Pulmonary asthma , sleep apnea , Current Smoker,    + rhonchi        Cardiovascular negative cardio ROS   Rhythm:Regular Rate:Normal     Neuro/Psych  Headaches, negative psych ROS   GI/Hepatic Neg liver ROS, GERD  ,  Endo/Other  diabetes, Type 2, Insulin Dependent  Renal/GU negative Renal ROS     Musculoskeletal negative musculoskeletal ROS (+)   Abdominal   Peds  Hematology negative hematology ROS (+)   Anesthesia Other Findings   Reproductive/Obstetrics                           Anesthesia Physical Anesthesia Plan  ASA: 2  Anesthesia Plan: General   Post-op Pain Management:    Induction: Intravenous  PONV Risk Score and Plan: 3 and Ondansetron, Dexamethasone and Midazolam  Airway Management Planned: Oral ETT  Additional Equipment: None  Intra-op Plan:   Post-operative Plan: Extubation in OR  Informed Consent: I have reviewed the patients History and Physical, chart, labs and discussed the procedure including the risks, benefits and alternatives for the proposed anesthesia with the patient or authorized representative who has indicated his/her understanding and acceptance.     Dental advisory given  Plan Discussed with: CRNA  Anesthesia Plan Comments: (PAT note written 06/08/2022 by Myra Gianotti, PA-C. )      Anesthesia Quick Evaluation

## 2022-06-08 NOTE — Progress Notes (Signed)
S.D.W- Instructions   Your procedure is scheduled on Thurs., Oct. 26, 2023 from 9:30AM-11:00AM.  Report to [facility] Main Entrance "A" at 7:00 A.M., then check in with the Admitting office.  Call this number if you have problems the morning of surgery:  [phone]   Remember:  Do not eat or drink after midnight on Oct. 25th    Take these medicines the morning of surgery with A SIP OF WATER: If Needed: Albuterol (PROVENTIL HFA;VENTOLIN HFA)  As of today, STOP taking any Aspirin (unless otherwise instructed by your surgeon) Aleve, Naproxen, Ibuprofen, Motrin, Advil, Goody's, BC's, all herbal medications, fish oil, and all vitamins.   How to Manage Your Diabetes Before and After Surgery  How do I manage my blood sugar before surgery? Check your blood sugar the morning of your surgery when you wake up and every 2 hours until you get to the Short Stay unit. If your blood sugar is less than 70 mg/dL, you will need to treat for low blood sugar: Do not take insulin. Treat a low blood sugar (less than 70 mg/dL) with  cup of clear juice (cranberry or apple), 4 glucose tablets, OR glucose gel. Recheck blood sugar in 15 minutes after treatment (to make sure it is greater than 70 mg/dL). If your blood sugar is not greater than 70 mg/dL on recheck, call [phone]  for further instructions. Report your blood sugar to the short stay nurse when you get to Short Stay.  WHAT DO I DO ABOUT MY DIABETES MEDICATION?  THE NIGHT BEFORE SURGERY, take _____12______ units of _____Lantus______insulin.       THE MORNING OF SURGERY, take ______12_______ units of _____Lantus_____insulin.  If your CBG is greater than 220 mg/dL, inform the staff upon arrival to Pre-Op.  Reviewed and Endorsed by [facility] Patient Education Committee, August 2015           Do not wear jewelry or makeup. Do not wear lotions, powders, perfumes, or deodorant. Do not shave 48 hours prior to surgery.   Do not bring  valuables to the hospital. Do not wear nail polish, gel polish, artificial nails, or any other type of covering on natural nails (fingers and toes) If you have artificial nails or gel coating that need to be removed by a nail salon, please have this removed prior to surgery. Artificial nails or gel coating may interfere with anesthesia's ability to adequately monitor your vital signs.  [facility] is not responsible for any belongings or valuables.    Do NOT Smoke (Tobacco/Vaping)  24 hours prior to your procedure  If you use a CPAP at night, you may bring your mask for your overnight stay.   Contacts, glasses, hearing aids, dentures or partials may not be worn into surgery, please bring cases for these belongings   For patients admitted to the hospital, discharge time will be determined by your treatment team.   Patients discharged the day of surgery will not be allowed to drive home, and someone needs to stay with them for 24 hours.  Special instructions:    Oral Hygiene is also important to reduce your risk of infection.  Remember - BRUSH YOUR TEETH THE MORNING OF SURGERY WITH YOUR REGULAR TOOTHPASTE  - Preparing For Surgery  Before surgery, you can play an important role. Because skin is not sterile, your skin needs to be as free of germs as possible. You can reduce the number of germs on your skin by washing with Antibacterial Soap  before surgery.     Please follow these instructions carefully.     Shower the NIGHT BEFORE SURGERY and the MORNING OF SURGERY with Antibacterial Soap.   Pat yourself dry with a CLEAN TOWEL.  Wear CLEAN PAJAMAS to bed the night before surgery  Place CLEAN SHEETS on your bed the night before your surgery  DO NOT SLEEP WITH PETS.  Day of Surgery:  Take a shower with Antibacterial soap. Wear Clean/Comfortable clothing the morning of surgery Do not apply any deodorants/lotions.   Remember to brush your teeth WITH YOUR REGULAR  TOOTHPASTE.   If you test positive for Covid, or been in contact with anyone that has tested positive in the last 10 days, please notify your surgeon.  SURGICAL WAITING ROOM VISITATION Patients having surgery or a procedure may have no more than 2 support people in the waiting area - these visitors may rotate.   Children under the age of 28 must have an adult with them who is not the patient. If the patient needs to stay at the hospital during part of their recovery, the visitor guidelines for inpatient rooms apply. Pre-op nurse will coordinate an appropriate time for 1 support person to accompany patient in pre-op.  This support person may not rotate.   Please refer to the [facility] website for the visitor guidelines for Inpatients (after your surgery is over and you are in a regular room).

## 2022-06-08 NOTE — Progress Notes (Signed)
Anesthesia Chart Review: Jessica Weiss  Case: [id number] Date/Time: 06/09/22 0915   Procedure: LAPAROSCOPIC CHOLECYSTECTOMY   Anesthesia type: General   Pre-op diagnosis: GALLSTONES   Location: [place] OR ROOM 02 / [place] OR   Surgeons: Dwan Bolt, MD       DISCUSSION: Patient is a 66 year old female scheduled for the above procedure.  History includes smoking, DM2, asthma, HLD, GERD, OSA (does not use CPAP), pernicious anemia, diverticulitis, right tibal fracture (s/p ORIF 12/2013), obesity.   For anesthesia history, she reported the following (as outlined in Dr. Ayesha Rumpf 05/30/22 Consult Note): "She did have an extensive reconstructive surgery on her leg about 10 years ago in [place], and per her report, during that surgery she got "agitated" with anesthesia and "my heart stopped...she was never diagnosed with any cardiac issues."  Dr. Zenia Resides requested and received records from her 2015 surgery and added, "Records from [facility] hospital in [place] received and reviewed. Patient had ORIF of a tibia fracture in May 2015, and per the records desatted to 90% intra-op and remained intubated for a brief period postop. She recovered and was discharged to rehab. There is no mention of cardiac arrest at any point. Will proceed with scheduling patient for surgery."   She denied prior cardiac testing. She denied chest pain and SOB per PAT RN phone interview.  She had a recent sleep study on 12/23/21 by Dr. Baird Lyons that showed mild OSA during study (AHI 5.9/hr) and mild oxygen desaturation (Min O2 86%, mean 90%); however, there was insufficient sleep time (71.5 minutes), so consider reassessment with sleep aid and/or home sleep study.   She reported most recent A1c of 8.0% with home CBG readings ~ 136-337.   She is a same day work-up, so labs and EKG on arrival as indicated. Anesthesia team to evaluate on the day of surgery.    VS: Ht 5\' 6"  (1.676 m)   Wt 102.5 kg   BMI 36.48 kg/m  Vital Signs  05/30/22 2:12 PM ([facility]) Vital Sign Reading  Blood Pressure 124/80  Pulse 80  Temperature 36.7 C (98.1 F)  Oxygen Saturation 95%  Weight 105.6 kg (232 lb 12.8 oz)  Height 167.6 cm (5\' 6" )  Body Mass Index 37.57    PROVIDERS: PCP is  Dr. Melodie Bouillon- [facility] Baird Lyons, MD is pulmonologist (sleep evaluation)   LABS: For day of surgery.   Spirometry 03/25/22:  FVC 1.71 (54%), post 1.78 (56%). FEV1 1.46 (60%), post 1.51 (62%) DLCO unc/cor 17.50 (88%)   IMAGES: [place] Abd 05/10/22: IMPRESSION: 1. Cholelithiasis without sonographic evidence for acute cholecystitis. 2. Echogenic liver parenchyma consistent with hepatic steatosis and or hepatocellular disease.   US Renal 11/30/21 (ordered by Dr. Gerarda Fraction): IMPRESSION: - There is no hydronephrosis. There are few small scattered linear hyperechoic foci in the central portions in both kidneys, possibly partial volume averaging artifacts. Possibility of small nonobstructing renal stones is not excluded. - There is 2.3 cm mixed echogenic area in the central portion of upper pole of right kidney. There is 1.8 cm hypoechoic structure in the upper pole of left kidney. Differential diagnostic possibilities would include hemorrhagic cysts or artifacts. Follow-up multiphasic CT may be considered.    EKG: For day of surgery as indicated.    CV: Denied prior stress test, echo, cardiac cath.    Past Medical History:  Diagnosis Date   Age related osteoporosis    Asthma    B12 deficiency    Complication  of anesthesia    told she became combative and "heart stopped" during May 2015 ORIF tibal fracture at [facility] in [place]; per record review, she "desatted to 90% intra-op and remained intubated for a brief prior postop..no mention of cardiac arrest", discharged to Rehab   Diabetes mellitus without complication (HCC)    Type II   GERD (gastroesophageal reflux disease)    Hyperlipidemia     Hypertriglyceridemia    Pernicious anemia    Sleep apnea    no Cpap   Tobacco abuse    Vitamin D deficiency     Past Surgical History:  Procedure Laterality Date   COLONOSCOPY     ESOPHAGOGASTRODUODENOSCOPY     LEG SURGERY Right    2015    MEDICATIONS: No current facility-administered medications for this encounter.    albuterol (PROVENTIL HFA;VENTOLIN HFA) 108 (90 Base) MCG/ACT inhaler   ARIPiprazole (ABILIFY) 2 MG tablet   insulin glargine (LANTUS SOLOSTAR) 100 UNIT/ML Solostar Pen   meloxicam (MOBIC) 15 MG tablet   rosuvastatin (CRESTOR) 20 MG tablet   STIOLTO RESPIMAT 2.5-2.5 MCG/ACT AERS    Myra Gianotti, PA-C Surgical Short Stay/Anesthesiology [facility] Phone [phone] [facility] Phone [phone] 06/08/2022 5:23 PM

## 2022-06-08 NOTE — Progress Notes (Signed)
Pt made aware of the importance of having someone 18 and over to stay with her for 24 hours after her surgery.

## 2022-06-09 ENCOUNTER — Ambulatory Visit (HOSPITAL_BASED_OUTPATIENT_CLINIC_OR_DEPARTMENT_OTHER): Payer: Medicare Other | Admitting: Vascular Surgery

## 2022-06-09 ENCOUNTER — Other Ambulatory Visit: Payer: Self-pay

## 2022-06-09 ENCOUNTER — Ambulatory Visit (HOSPITAL_COMMUNITY): Payer: Medicare Other | Admitting: Vascular Surgery

## 2022-06-09 ENCOUNTER — Ambulatory Visit (HOSPITAL_COMMUNITY)
Admission: RE | Admit: 2022-06-09 | Discharge: 2022-06-09 | Disposition: A | Discharge status: Home / Self Care | Payer: Medicare Other | Source: Ambulatory Visit | Attending: Surgery | Admitting: Surgery

## 2022-06-09 ENCOUNTER — Encounter (HOSPITAL_COMMUNITY): Payer: Self-pay | Admitting: Surgery

## 2022-06-09 ENCOUNTER — Encounter (HOSPITAL_COMMUNITY)
Admission: RE | Disposition: A | Discharge status: Home / Self Care | Payer: Self-pay | Source: Ambulatory Visit | Attending: Surgery

## 2022-06-09 DIAGNOSIS — E119 Type 2 diabetes mellitus without complications: Secondary | ICD-10-CM | POA: Diagnosis not present

## 2022-06-09 DIAGNOSIS — F17210 Nicotine dependence, cigarettes, uncomplicated: Secondary | ICD-10-CM | POA: Diagnosis not present

## 2022-06-09 DIAGNOSIS — J45909 Unspecified asthma, uncomplicated: Secondary | ICD-10-CM | POA: Diagnosis not present

## 2022-06-09 DIAGNOSIS — Z6837 Body mass index (BMI) 37.0-37.9, adult: Secondary | ICD-10-CM | POA: Diagnosis not present

## 2022-06-09 DIAGNOSIS — K8010 Calculus of gallbladder with chronic cholecystitis without obstruction: Secondary | ICD-10-CM | POA: Insufficient documentation

## 2022-06-09 DIAGNOSIS — K219 Gastro-esophageal reflux disease without esophagitis: Secondary | ICD-10-CM | POA: Insufficient documentation

## 2022-06-09 DIAGNOSIS — Z794 Long term (current) use of insulin: Secondary | ICD-10-CM | POA: Diagnosis not present

## 2022-06-09 DIAGNOSIS — K8020 Calculus of gallbladder without cholecystitis without obstruction: Secondary | ICD-10-CM | POA: Diagnosis not present

## 2022-06-09 DIAGNOSIS — G4730 Sleep apnea, unspecified: Secondary | ICD-10-CM | POA: Diagnosis not present

## 2022-06-09 DIAGNOSIS — E781 Pure hyperglyceridemia: Secondary | ICD-10-CM | POA: Diagnosis not present

## 2022-06-09 HISTORY — DX: Vitamin B12 deficiency anemia due to intrinsic factor deficiency: D51.0

## 2022-06-09 HISTORY — DX: Sleep apnea, unspecified: G47.30

## 2022-06-09 HISTORY — PX: CHOLECYSTECTOMY: SHX55

## 2022-06-09 HISTORY — DX: Other complications of anesthesia, initial encounter: T88.59XA

## 2022-06-09 LAB — GLUCOSE, CAPILLARY
Glucose-Capillary: 156 mg/dL — ABNORMAL HIGH (ref 70–99)
Glucose-Capillary: 209 mg/dL — ABNORMAL HIGH (ref 70–99)

## 2022-06-09 LAB — CBC
HCT: 45.8 % (ref 36.0–46.0)
Hemoglobin: 14.8 g/dL (ref 12.0–15.0)
MCH: 29.1 pg (ref 26.0–34.0)
MCHC: 32.3 g/dL (ref 30.0–36.0)
MCV: 90.2 fL (ref 80.0–100.0)
Platelets: 260 10*3/uL (ref 150–400)
RBC: 5.08 MIL/uL (ref 3.87–5.11)
RDW: 13.2 % (ref 11.5–15.5)
WBC: 11.6 10*3/uL — ABNORMAL HIGH (ref 4.0–10.5)
nRBC: 0 % (ref 0.0–0.2)

## 2022-06-09 LAB — BASIC METABOLIC PANEL
Anion gap: 10 (ref 5–15)
BUN: 10 mg/dL (ref 8–23)
CO2: 23 mmol/L (ref 22–32)
Calcium: 8.3 mg/dL — ABNORMAL LOW (ref 8.9–10.3)
Chloride: 106 mmol/L (ref 98–111)
Creatinine, Ser: 0.55 mg/dL (ref 0.44–1.00)
GFR, Estimated: >60 mL/min (ref >60)
Glucose, Bld: 161 mg/dL — ABNORMAL HIGH (ref 70–99)
Potassium: 3.5 mmol/L (ref 3.5–5.1)
Sodium: 139 mmol/L (ref 135–145)

## 2022-06-09 SURGERY — LAPAROSCOPIC CHOLECYSTECTOMY
Anesthesia: General | Site: Abdomen

## 2022-06-09 MED ORDER — GABAPENTIN 300 MG PO CAPS
300.0000 mg | ORAL_CAPSULE | ORAL | Status: DC
  Filled 2022-06-09: qty 1

## 2022-06-09 MED ORDER — CHLORHEXIDINE GLUCONATE 0.12 % MT SOLN
15.0000 mL | Freq: Once | OROMUCOSAL | Status: AC
  Administered 2022-06-09: 15 mL via OROMUCOSAL
  Filled 2022-06-09: qty 15

## 2022-06-09 MED ORDER — ONDANSETRON HCL 4 MG/2ML IJ SOLN
INTRAMUSCULAR | Status: DC | PRN
  Administered 2022-06-09: 4 mg via INTRAVENOUS

## 2022-06-09 MED ORDER — DEXAMETHASONE SODIUM PHOSPHATE 10 MG/ML IJ SOLN
INTRAMUSCULAR | Status: AC
  Filled 2022-06-09: qty 2

## 2022-06-09 MED ORDER — 0.9 % SODIUM CHLORIDE (POUR BTL) OPTIME
TOPICAL | Status: DC | PRN
  Administered 2022-06-09: 1000 mL

## 2022-06-09 MED ORDER — PROMETHAZINE HCL 25 MG/ML IJ SOLN
INTRAMUSCULAR | Status: AC
  Filled 2022-06-09: qty 1

## 2022-06-09 MED ORDER — OXYCODONE HCL 5 MG PO TABS: 5.0000 mg | ORAL_TABLET | Freq: Once | ORAL | Status: DC | PRN

## 2022-06-09 MED ORDER — EPHEDRINE 5 MG/ML INJ
INTRAVENOUS | Status: AC
  Filled 2022-06-09: qty 5

## 2022-06-09 MED ORDER — MIDAZOLAM HCL 2 MG/2ML IJ SOLN
INTRAMUSCULAR | Status: AC
  Filled 2022-06-09: qty 2

## 2022-06-09 MED ORDER — PROMETHAZINE HCL 25 MG/ML IJ SOLN
6.2500 mg | INTRAMUSCULAR | Status: DC | PRN
  Administered 2022-06-09: 12.5 mg via INTRAVENOUS

## 2022-06-09 MED ORDER — HYDROCODONE-ACETAMINOPHEN 5-325 MG PO TABS: 1.0000 | ORAL_TABLET | Freq: Four times a day (QID) | ORAL | 0 refills | Status: AC | PRN

## 2022-06-09 MED ORDER — IPRATROPIUM-ALBUTEROL 0.5-2.5 (3) MG/3ML IN SOLN
RESPIRATORY_TRACT | Status: AC
  Filled 2022-06-09: qty 3

## 2022-06-09 MED ORDER — ACETAMINOPHEN 160 MG/5ML PO SOLN: 325.0000 mg | ORAL | Status: DC | PRN

## 2022-06-09 MED ORDER — OXYCODONE HCL 5 MG/5ML PO SOLN: 5.0000 mg | Freq: Once | ORAL | Status: DC | PRN

## 2022-06-09 MED ORDER — MIDAZOLAM HCL 2 MG/2ML IJ SOLN
INTRAMUSCULAR | Status: DC | PRN
  Administered 2022-06-09: 2 mg via INTRAVENOUS

## 2022-06-09 MED ORDER — FENTANYL CITRATE (PF) 100 MCG/2ML IJ SOLN
INTRAMUSCULAR | Status: AC
  Filled 2022-06-09: qty 2

## 2022-06-09 MED ORDER — ONDANSETRON HCL 4 MG/2ML IJ SOLN
INTRAMUSCULAR | Status: AC
  Filled 2022-06-09: qty 4

## 2022-06-09 MED ORDER — ORAL CARE MOUTH RINSE: 15.0000 mL | Freq: Once | OROMUCOSAL | Status: AC

## 2022-06-09 MED ORDER — LIDOCAINE 2% (20 MG/ML) 5 ML SYRINGE
INTRAMUSCULAR | Status: AC
  Filled 2022-06-09: qty 15

## 2022-06-09 MED ORDER — ALBUTEROL SULFATE HFA 108 (90 BASE) MCG/ACT IN AERS
INHALATION_SPRAY | RESPIRATORY_TRACT | Status: DC | PRN
  Administered 2022-06-09: 2 via RESPIRATORY_TRACT

## 2022-06-09 MED ORDER — IPRATROPIUM-ALBUTEROL 0.5-2.5 (3) MG/3ML IN SOLN
3.0000 mL | RESPIRATORY_TRACT | Status: DC
  Administered 2022-06-09: 3 mL via RESPIRATORY_TRACT

## 2022-06-09 MED ORDER — LACTATED RINGERS IV SOLN: INTRAVENOUS | Status: DC

## 2022-06-09 MED ORDER — FENTANYL CITRATE (PF) 250 MCG/5ML IJ SOLN
INTRAMUSCULAR | Status: DC | PRN
  Administered 2022-06-09 (×5): 50 ug via INTRAVENOUS

## 2022-06-09 MED ORDER — ROCURONIUM BROMIDE 10 MG/ML (PF) SYRINGE
PREFILLED_SYRINGE | INTRAVENOUS | Status: AC
  Filled 2022-06-09: qty 30

## 2022-06-09 MED ORDER — AMISULPRIDE (ANTIEMETIC) 5 MG/2ML IV SOLN: 10.0000 mg | Freq: Once | INTRAVENOUS | Status: DC | PRN

## 2022-06-09 MED ORDER — BUPIVACAINE-EPINEPHRINE 0.25% -1:200000 IJ SOLN
INTRAMUSCULAR | Status: DC | PRN
  Administered 2022-06-09: 30 mL

## 2022-06-09 MED ORDER — CEFAZOLIN SODIUM-DEXTROSE 2-4 GM/100ML-% IV SOLN
2.0000 g | INTRAVENOUS | Status: AC
  Administered 2022-06-09: 2 g via INTRAVENOUS
  Filled 2022-06-09: qty 100

## 2022-06-09 MED ORDER — ACETAMINOPHEN 10 MG/ML IV SOLN: 1000.0000 mg | Freq: Once | INTRAVENOUS | Status: DC | PRN

## 2022-06-09 MED ORDER — PROPOFOL 10 MG/ML IV BOLUS
INTRAVENOUS | Status: DC | PRN
  Administered 2022-06-09: 140 mg via INTRAVENOUS

## 2022-06-09 MED ORDER — PHENYLEPHRINE 80 MCG/ML (10ML) SYRINGE FOR IV PUSH (FOR BLOOD PRESSURE SUPPORT)
PREFILLED_SYRINGE | INTRAVENOUS | Status: AC
  Filled 2022-06-09: qty 10

## 2022-06-09 MED ORDER — ACETAMINOPHEN 325 MG PO TABS: 325.0000 mg | ORAL_TABLET | ORAL | Status: DC | PRN

## 2022-06-09 MED ORDER — LIDOCAINE 2% (20 MG/ML) 5 ML SYRINGE
INTRAMUSCULAR | Status: DC | PRN
  Administered 2022-06-09: 40 mg via INTRAVENOUS

## 2022-06-09 MED ORDER — ACETAMINOPHEN 500 MG PO TABS
1000.0000 mg | ORAL_TABLET | ORAL | Status: AC
  Administered 2022-06-09: 1000 mg via ORAL
  Filled 2022-06-09: qty 2

## 2022-06-09 MED ORDER — SUGAMMADEX SODIUM 200 MG/2ML IV SOLN
INTRAVENOUS | Status: DC | PRN
  Administered 2022-06-09: 200 mg via INTRAVENOUS
  Administered 2022-06-09: 100 mg via INTRAVENOUS

## 2022-06-09 MED ORDER — PHENYLEPHRINE 80 MCG/ML (10ML) SYRINGE FOR IV PUSH (FOR BLOOD PRESSURE SUPPORT)
PREFILLED_SYRINGE | INTRAVENOUS | Status: DC | PRN
  Administered 2022-06-09 (×2): 80 ug via INTRAVENOUS

## 2022-06-09 MED ORDER — FENTANYL CITRATE (PF) 100 MCG/2ML IJ SOLN
25.0000 ug | INTRAMUSCULAR | Status: DC | PRN
  Administered 2022-06-09: 50 ug via INTRAVENOUS

## 2022-06-09 MED ORDER — FENTANYL CITRATE (PF) 250 MCG/5ML IJ SOLN
INTRAMUSCULAR | Status: AC
  Filled 2022-06-09: qty 5

## 2022-06-09 MED ORDER — ROCURONIUM BROMIDE 10 MG/ML (PF) SYRINGE
PREFILLED_SYRINGE | INTRAVENOUS | Status: DC | PRN
  Administered 2022-06-09: 60 mg via INTRAVENOUS
  Administered 2022-06-09: 20 mg via INTRAVENOUS

## 2022-06-09 MED ORDER — SODIUM CHLORIDE 0.9 % IR SOLN
Status: DC | PRN
  Administered 2022-06-09: 1000 mL

## 2022-06-09 MED ORDER — DEXAMETHASONE SODIUM PHOSPHATE 10 MG/ML IJ SOLN
INTRAMUSCULAR | Status: DC | PRN
  Administered 2022-06-09: 4 mg via INTRAVENOUS

## 2022-06-09 SURGICAL SUPPLY — 39 items
APPLIER CLIP 5 13 M/L LIGAMAX5 (MISCELLANEOUS) ×1
BAG COUNTER SPONGE SURGICOUNT (BAG) ×1 IMPLANT
BLADE CLIPPER SURG (BLADE) IMPLANT
CANISTER SUCT 3000ML PPV (MISCELLANEOUS) ×1 IMPLANT
CHLORAPREP W/TINT 26 (MISCELLANEOUS) ×1 IMPLANT
CLIP APPLIE 5 13 M/L LIGAMAX5 (MISCELLANEOUS) ×1 IMPLANT
COVER SURGICAL LIGHT HANDLE (MISCELLANEOUS) ×1 IMPLANT
DERMABOND ADVANCED .7 DNX12 (GAUZE/BANDAGES/DRESSINGS) ×1 IMPLANT
ELECT REM PT RETURN 9FT ADLT (ELECTROSURGICAL) ×1
ELECTRODE REM PT RTRN 9FT ADLT (ELECTROSURGICAL) ×1 IMPLANT
GLOVE BIOGEL PI IND STRL 6 (GLOVE) ×1 IMPLANT
GLOVE BIOGEL PI MICRO STRL 5.5 (GLOVE) ×1 IMPLANT
GOWN STRL REUS W/ TWL LRG LVL3 (GOWN DISPOSABLE) ×3 IMPLANT
GOWN STRL REUS W/TWL LRG LVL3 (GOWN DISPOSABLE) ×3
KIT BASIN OR (CUSTOM PROCEDURE TRAY) ×1 IMPLANT
KIT TURNOVER KIT B (KITS) ×1 IMPLANT
L-HOOK LAP DISP 36CM (ELECTROSURGICAL) ×1
LHOOK LAP DISP 36CM (ELECTROSURGICAL) ×1 IMPLANT
NDL INSUFFLATION 14GA 120MM (NEEDLE) IMPLANT
NEEDLE INSUFFLATION 14GA 120MM (NEEDLE) IMPLANT
NS IRRIG 1000ML POUR BTL (IV SOLUTION) ×1 IMPLANT
PAD ARMBOARD 7.5X6 YLW CONV (MISCELLANEOUS) ×1 IMPLANT
PENCIL BUTTON HOLSTER BLD 10FT (ELECTRODE) ×1 IMPLANT
POUCH RETRIEVAL ECOSAC 10 (ENDOMECHANICALS) IMPLANT
POUCH RETRIEVAL ECOSAC 10MM (ENDOMECHANICALS)
POUCH SPECIMEN RETRIEVAL 10MM (ENDOMECHANICALS) IMPLANT
SCISSORS LAP 5X35 DISP (ENDOMECHANICALS) ×1 IMPLANT
SET IRRIG TUBING LAPAROSCOPIC (IRRIGATION / IRRIGATOR) ×1 IMPLANT
SET TUBE SMOKE EVAC HIGH FLOW (TUBING) ×1 IMPLANT
SLEEVE ENDOPATH XCEL 5M (ENDOMECHANICALS) ×2 IMPLANT
SUT MNCRL AB 4-0 PS2 18 (SUTURE) ×1 IMPLANT
TOWEL GREEN STERILE (TOWEL DISPOSABLE) ×1 IMPLANT
TOWEL GREEN STERILE FF (TOWEL DISPOSABLE) ×1 IMPLANT
TRAY LAPAROSCOPIC MC (CUSTOM PROCEDURE TRAY) ×1 IMPLANT
TROCAR XCEL 12X100 BLDLESS (ENDOMECHANICALS) IMPLANT
TROCAR XCEL BLUNT TIP 100MML (ENDOMECHANICALS) ×1 IMPLANT
TROCAR Z-THREAD OPTICAL 5X100M (TROCAR) ×1 IMPLANT
WARMER LAPAROSCOPE (MISCELLANEOUS) ×1 IMPLANT
WATER STERILE IRR 1000ML POUR (IV SOLUTION) ×1 IMPLANT

## 2022-06-09 NOTE — Transfer of Care (Signed)
Immediate Anesthesia Transfer of Care Note  Patient: Jessica Weiss  Procedure(s) Performed: LAPAROSCOPIC CHOLECYSTECTOMY (Abdomen)  Patient Location: PACU  Anesthesia Type:General  Level of Consciousness: awake, alert  and oriented  Airway & Oxygen Therapy: Patient Spontanous Breathing, Patient connected to nasal cannula oxygen and Patient connected to face mask oxygen  Post-op Assessment: Report given to RN, Post -op Vital signs reviewed and stable and Patient moving all extremities X 4  Post vital signs: Reviewed and stable  Last Vitals:  Vitals Value Taken Time  BP 126/75 06/09/22 1034  Temp    Pulse 91 06/09/22 1036  Resp 16 06/09/22 1036  SpO2 93 % 06/09/22 1036  Vitals shown include unvalidated device data.  Last Pain:  Vitals:   06/09/22 0830  PainSc: 4       Patients Stated Pain Goal: 3 (68/08/81 1031)  Complications: No notable events documented.

## 2022-06-09 NOTE — Interval H&P Note (Signed)
History and Physical Interval Note:  06/09/2022 8:37 AM  Jessica Weiss  has presented today for surgery, with the diagnosis of GALLSTONES.  The various methods of treatment have been discussed with the patient and family. After consideration of risks, benefits and other options for treatment, the patient has consented to  Procedure(s): LAPAROSCOPIC CHOLECYSTECTOMY (N/A) as a surgical intervention.  The patient's history has been reviewed, patient examined, no change in status, stable for surgery.  I have reviewed the patient's chart and labs.  Questions were answered to the patient's satisfaction.     Dwan Bolt

## 2022-06-09 NOTE — Anesthesia Procedure Notes (Signed)
Procedure Name: Intubation Date/Time: 06/09/2022 9:11 AM  Performed by: Mariea Clonts, CRNAPre-anesthesia Checklist: Patient identified, Emergency Drugs available, Suction available and Patient being monitored Patient Re-evaluated:Patient Re-evaluated prior to induction Oxygen Delivery Method: Circle System Utilized Preoxygenation: Pre-oxygenation with 100% oxygen Induction Type: IV induction Ventilation: Mask ventilation without difficulty Laryngoscope Size: Miller and 2 Grade View: Grade I Tube type: Oral Tube size: 7.5 mm Number of attempts: 1 Airway Equipment and Method: Stylet and Oral airway Placement Confirmation: ETT inserted through vocal cords under direct vision, positive ETCO2 and breath sounds checked- equal and bilateral Tube secured with: Tape Dental Injury: Teeth and Oropharynx as per pre-operative assessment

## 2022-06-09 NOTE — Discharge Instructions (Addendum)
CENTRAL  SURGERY DISCHARGE INSTRUCTIONS  Activity No heavy lifting greater than 15 pounds for 4 weeks after surgery. Ok to shower in 24 hours, but do not bathe or submerge incisions underwater. Do not drive while taking narcotic pain medication.  Wound Care Your incisions are covered with skin glue called Dermabond. This will peel off on its own over time. You may shower and allow warm soapy water to run over your incisions. Gently pat dry. Do not submerge your incision underwater. Monitor your incision for any new redness, tenderness, or drainage.  When to Call us: Fever greater than 100.5 New redness, drainage, or swelling at incision site Severe pain, nausea, or vomiting Jaundice (yellowing of the whites of the eyes or skin)  Follow-up You have an appointment scheduled with Dr. Zenia Resides on June 24, 2022 at 10:20am. This will be at the [facility] Surgery office at [address] [place], [place], [place]. Please arrive at least 15 minutes prior to your scheduled appointment time.  For questions or concerns, please call the office at [phone].

## 2022-06-09 NOTE — Anesthesia Postprocedure Evaluation (Signed)
Anesthesia Post Note  Patient: Jessica Weiss  Procedure(s) Performed: LAPAROSCOPIC CHOLECYSTECTOMY (Abdomen)     Patient location during evaluation: PACU Anesthesia Type: General Level of consciousness: awake and alert Pain management: pain level controlled Vital Signs Assessment: post-procedure vital signs reviewed and stable Respiratory status: spontaneous breathing, nonlabored ventilation, respiratory function stable and patient connected to nasal cannula oxygen Cardiovascular status: blood pressure returned to baseline and stable Postop Assessment: no apparent nausea or vomiting Anesthetic complications: no   No notable events documented.  Last Vitals:  Vitals:   06/09/22 1119 06/09/22 1134  BP: 132/74 136/79  Pulse: 89 80  Resp: 18 18  Temp:  36.4 C  SpO2: 95% 99%    Last Pain:  Vitals:   06/09/22 1134  PainSc: Byron Collins Dim[place]

## 2022-06-09 NOTE — Op Note (Signed)
Date: 06/09/22  Patient: Jessica Weiss MRN: [id number]  Preoperative Diagnosis: Symptomatic cholelithiasis Postoperative Diagnosis: Same  Procedure: Laparoscopic cholecystectomy  Surgeon: Michaelle Birks, MD  EBL: Minimal  Anesthesia: General endotracheal  Specimens: Gallbladder  Indications: Ms. [name] is a 66 yo female who has been having postprandial RUQ pain for several years, which has recently gotten worse. US showed a large gallstone. After a discussion of the risks and benefits of surgery, she consented to proceed with cholecystectomy.  Findings: Large stone within the gallbladder. No evidence of acute cholecystitis.  Procedure details: Informed consent was obtained in the preoperative area prior to the procedure. The patient was brought to the operating room and placed on the table in the supine position. General anesthesia was induced and appropriate lines and drains were placed for intraoperative monitoring. Perioperative antibiotics were administered per SCIP guidelines. The abdomen was prepped and draped in the usual sterile fashion. A pre-procedure timeout was taken verifying patient identity, surgical site and procedure to be performed.  A small supraumbilical skin incision was made, the subcutaneous tissue was divided with cautery, and the umbilical stalk was grasped and elevated. A Veress needle was inserted through the fascia, intraperitoneal placement was confirmed with the saline drop test, and the abdomen was insufflated. The peritoneal cavity was inspected with no evidence of visceral or vascular injury. Three 54mm ports were placed in the right subcostal margin, all under direct visualization. The fundus of the gallbladder was grasped and retracted cephalad. The infundibulum was retracted laterally. There were minimal omental adhesions to the infundibulum, which were carefully divided close to the gallbladder with cautery. The cystic triangle was dissected  out using cautery and blunt dissection, and the critical view of safety was obtained. The cystic duct and cystic artery were clipped and ligated, leaving two clips behind on the cystic duct stump. The gallbladder was taken off the liver using cautery, and the specimen was placed in an endocatch bag. The surgical site was irrigated with saline until the effluent was clear. Hemostasis was achieved in the gallbladder fossa using cautery. The cystic duct and artery stumps were visually inspected and there was no evidence of bile leak or bleeding. The ports were removed and the abdomen was desufflated. The specimen was extracted via the umbilical port site. This required extension of the both the fascial and skin incisions due to the presence of a large stone within the gallbladder. The umbilical port site fascia was then closed with a 0 vicryl figure-of-eight sutures. The skin at all port sites was closed with 4-0 monocryl subcuticular suture. Dermabond was applied.  The patient tolerated the procedure well with no apparent complications. All counts were correct x2 at the end of the procedure. The patient was extubated and taken to PACU in stable condition.  Michaelle Birks, MD 06/09/22 10:38 AM

## 2022-06-10 ENCOUNTER — Encounter (HOSPITAL_COMMUNITY): Payer: Self-pay | Admitting: Surgery

## 2022-06-10 LAB — SURGICAL PATHOLOGY

## 2022-07-04 DIAGNOSIS — E1169 Type 2 diabetes mellitus with other specified complication: Secondary | ICD-10-CM | POA: Diagnosis not present

## 2022-07-04 DIAGNOSIS — E559 Vitamin D deficiency, unspecified: Secondary | ICD-10-CM | POA: Diagnosis not present

## 2022-08-04 ENCOUNTER — Emergency Department (HOSPITAL_COMMUNITY)
Admission: EM | Admit: 2022-08-04 | Discharge: 2022-08-05 | Disposition: A | Discharge status: Home / Self Care | Payer: Medicare Other | Attending: Emergency Medicine | Admitting: Emergency Medicine

## 2022-08-04 ENCOUNTER — Emergency Department (HOSPITAL_COMMUNITY): Payer: Medicare Other

## 2022-08-04 ENCOUNTER — Encounter (HOSPITAL_COMMUNITY): Payer: Self-pay

## 2022-08-04 ENCOUNTER — Other Ambulatory Visit: Payer: Self-pay

## 2022-08-04 DIAGNOSIS — J45909 Unspecified asthma, uncomplicated: Secondary | ICD-10-CM | POA: Diagnosis not present

## 2022-08-04 DIAGNOSIS — Z7951 Long term (current) use of inhaled steroids: Secondary | ICD-10-CM | POA: Insufficient documentation

## 2022-08-04 DIAGNOSIS — R030 Elevated blood-pressure reading, without diagnosis of hypertension: Secondary | ICD-10-CM | POA: Insufficient documentation

## 2022-08-04 DIAGNOSIS — E119 Type 2 diabetes mellitus without complications: Secondary | ICD-10-CM | POA: Diagnosis not present

## 2022-08-04 DIAGNOSIS — L03211 Cellulitis of face: Secondary | ICD-10-CM | POA: Insufficient documentation

## 2022-08-04 DIAGNOSIS — Z794 Long term (current) use of insulin: Secondary | ICD-10-CM | POA: Insufficient documentation

## 2022-08-04 DIAGNOSIS — R220 Localized swelling, mass and lump, head: Secondary | ICD-10-CM | POA: Diagnosis not present

## 2022-08-04 LAB — I-STAT CHEM 8, ED
BUN: 14 mg/dL (ref 8–23)
Calcium, Ion: 1.01 mmol/L — ABNORMAL LOW (ref 1.15–1.40)
Chloride: 105 mmol/L (ref 98–111)
Creatinine, Ser: 0.5 mg/dL (ref 0.44–1.00)
Glucose, Bld: 177 mg/dL — ABNORMAL HIGH (ref 70–99)
HCT: 45 % (ref 36.0–46.0)
Hemoglobin: 15.3 g/dL — ABNORMAL HIGH (ref 12.0–15.0)
Potassium: 4.8 mmol/L (ref 3.5–5.1)
Sodium: 138 mmol/L (ref 135–145)
TCO2: 25 mmol/L (ref 22–32)

## 2022-08-04 MED ORDER — IOHEXOL 350 MG/ML SOLN
75.0000 mL | Freq: Once | INTRAVENOUS | Status: AC | PRN
  Administered 2022-08-04: 75 mL via INTRAVENOUS

## 2022-08-04 NOTE — ED Notes (Signed)
Ice pack given to pt.

## 2022-08-04 NOTE — ED Triage Notes (Signed)
Pt c/o left sided facial swelling x 2 days. Pt denies difficulty swallowing. Pt has seen PCP for same, taken ibuprofen and toradol, tried ice and warm compresses w/o relief.  Pt has swelling to left lower jaw/chin, pt states it has been very painful.

## 2022-08-04 NOTE — ED Provider Triage Note (Signed)
Emergency Medicine Provider Triage Evaluation Note  Jessica Weiss , a 66 y.o. female  was evaluated in triage.  Pt complains of left-sided lip and facial swelling.  Patient stated she started a new diabetic medication earlier this week with her first injection being Tuesday night.  She noticed some swelling on Wednesday morning was seen by her primary care provider.  Her primary care provider thought she might be having a localized allergic reaction due to the new medication.  She was given Benadryl and a Toradol injection.  She states the Toradol did help her sleep last night and that she is continue to have swelling with pain in that area.  She states the Benadryl has not helped her symptoms at all.  She states the swelling has worsened overnight and into today at that her primary care provider recommended she come to the emergency department for further evaluation.  Review of Systems  Positive: As above Negative: As above Physical Exam  Ht 5\' 5"  (1.651 m)   Wt 104.3 kg   BMI 38.27 kg/m  Gen:   Awake, no distress   Resp:  Normal effort  MSK:   Moves extremities without difficulty  Other:  Left sided face and lip swelling, no tongue swelling noted  Medical Decision Making  Medically screening exam initiated at 9:51 PM.  Appropriate orders placed.  Julianne Rice was informed that the remainder of the evaluation will be completed by another provider, this initial triage assessment does not replace that evaluation, and the importance of remaining in the ED until their evaluation is complete.  Patient denies any difficulty with swallowing at this time, denies difficulty breathing   Domingo Cocking 08/04/22 2153

## 2022-08-05 MED ORDER — OXYCODONE-ACETAMINOPHEN 5-325 MG PO TABS
2.0000 | ORAL_TABLET | Freq: Once | ORAL | Status: AC
  Administered 2022-08-05: 2 via ORAL
  Filled 2022-08-05: qty 2

## 2022-08-05 MED ORDER — CLINDAMYCIN HCL 150 MG PO CAPS
150.0000 mg | ORAL_CAPSULE | Freq: Once | ORAL | Status: AC
  Administered 2022-08-05: 150 mg via ORAL
  Filled 2022-08-05: qty 1

## 2022-08-05 MED ORDER — HYDROCODONE-ACETAMINOPHEN 5-325 MG PO TABS: 1.0000 | ORAL_TABLET | ORAL | 0 refills | Status: AC | PRN

## 2022-08-05 MED ORDER — CLINDAMYCIN HCL 150 MG PO CAPS: 150.0000 mg | ORAL_CAPSULE | Freq: Four times a day (QID) | ORAL | 0 refills | Status: AC

## 2022-08-05 NOTE — ED Notes (Signed)
Pt pulls iv out of arm without waiting on nurse to remove with proper material after telling nurse "hurry up and pull it or I will pull it out." Nurse brings material to control bleeding.

## 2022-08-05 NOTE — ED Provider Notes (Signed)
[facility] EMERGENCY DEPARTMENT Provider Note   CSN: [id number] Arrival date & time: 08/04/22  2045     History  Chief Complaint  Patient presents with   Facial Swelling    Jessica Weiss is a 66 y.o. female.  The history is provided by the patient.  She has history of diabetes, hyperlipidemia, asthma and comes in because of swelling of the left side of her face over the last 3-4 days.  She relates symptoms seem to start after she was started on a new diabetes medicine.  She denies fever or chills.  She is complaining of pain in the same area.   Home Medications Prior to Admission medications   Medication Sig Start Date End Date Taking? Authorizing Provider  clindamycin (CLEOCIN) 150 MG capsule Take 1 capsule (150 mg total) by mouth every 6 (six) hours. 08/05/22  Yes Dione Booze, MD  HYDROcodone-acetaminophen [facility]) 5-325 MG tablet Take 1 tablet by mouth every 4 (four) hours as needed for moderate pain. 08/05/22  Yes Dione Booze, MD  albuterol (PROVENTIL HFA;VENTOLIN HFA) 108 (90 Base) MCG/ACT inhaler Inhale 2 puffs into the lungs every 6 (six) hours as needed for wheezing or shortness of breath. 06/11/18   Emily Filbert, MD  ARIPiprazole (ABILIFY) 2 MG tablet Take 2 mg by mouth daily. 03/10/22   [provider]  insulin glargine (LANTUS SOLOSTAR) 100 UNIT/ML Solostar Pen Inject 25 Units into the skin 2 (two) times daily.    [provider]  meloxicam (MOBIC) 15 MG tablet Take 15 mg by mouth daily. 02/16/22   [provider]  rosuvastatin (CRESTOR) 20 MG tablet Take 20 mg by mouth daily. 05/09/22   [provider]  STIOLTO RESPIMAT 2.5-2.5 MCG/ACT AERS Inhale 2 each into the lungs daily. 02/24/22   [provider]      Allergies    Morphine and related, Metformin, and Sulfa antibiotics    Review of Systems   Review of Systems  All other systems reviewed and are negative.   Physical Exam Updated  Vital Signs BP (!) 155/98 (BP Location: Right Arm)   Pulse 95   Temp 98.9 F (37.2 C) (Oral)   Resp 16   Ht 5\' 5"  (1.651 m)   Wt 104.3 kg   SpO2 95%   BMI 38.27 kg/m  Physical Exam Vitals and nursing note reviewed.   66 year old female, resting comfortably and in no acute distress. Vital signs are significant for elevated blood pressure. Oxygen saturation is 95%, which is normal. Head is normocephalic and atraumatic. PERRLA, EOMI. There is mild to moderate swelling of the left side of the face around the left lip and malar area.  There is some induration noted in the region of the left lower lip.  No intraoral swelling noted and dentition is in fairly good condition. Neck is nontender and supple without adenopathy or JVD. Back is nontender and there is no CVA tenderness. Lungs are clear without rales, wheezes, or rhonchi. Chest is nontender. Heart has regular rate and rhythm without murmur. Abdomen is soft, flat, nontender. Extremities have no cyanosis or edema, full range of motion is present. Skin is warm and dry without rash. Neurologic: Mental status is normal, cranial nerves are intact, moves all extremities equally.  ED Results / Procedures / Treatments   Labs (all labs ordered are listed, but only abnormal results are displayed) Labs Reviewed  I-STAT CHEM 8, ED - Abnormal; Notable for the following components:  Result Value   Glucose, Bld 177 (*)    Calcium, Ion 1.01 (*)    Hemoglobin 15.3 (*)    All other components within normal limits   Radiology CT Maxillofacial W Contrast  Result Date: 08/05/2022 CLINICAL DATA:  Initial evaluation for acute facial swelling. EXAM: CT MAXILLOFACIAL WITH CONTRAST TECHNIQUE: Multidetector CT imaging of the maxillofacial structures was performed with intravenous contrast. Multiplanar CT image reconstructions were also generated. RADIATION DOSE REDUCTION: This exam was performed according to the departmental dose-optimization  program which includes automated exposure control, adjustment of the mA and/or kV according to patient size and/or use of iterative reconstruction technique. CONTRAST:  57mL OMNIPAQUE IOHEXOL 350 MG/ML SOLN COMPARISON:  None Available. FINDINGS: Osseous: No acute osseous finding. No discrete or worrisome osseous lesions. Orbits: Unremarkable. Sinuses: Paranasal sinuses are largely clear. Mastoid air cells and middle ear cavities are well pneumatized and free of fluid. Soft tissues: Asymmetric soft tissue swelling with hazy inflammatory stranding seen involving the left face, most pronounced adjacent to the left mandibular body. Involvement of the left upper and lower lips noted. Findings suspicious for acute infection/cellulitis. Findings most commonly related to an odontogenic source. Ill-defined phlegmonous changes seen within this region without discrete abscess or drainable fluid collection. No extension into the deeper spaces of the face/neck at this time. Limited intracranial: Unremarkable. IMPRESSION: Asymmetric soft tissue swelling with inflammatory stranding involving the left face, suggestive of acute infection/cellulitis. Findings most commonly related to an odontogenic source. Ill-defined phlegmonous changes seen within this region without discrete abscess or drainable fluid collection at this time. Electronically Signed   By: Rise Mu M.D.   On: 08/05/2022 00:02    Procedures Procedures    Medications Ordered in ED Medications  iohexol (OMNIPAQUE) 350 MG/ML injection 75 mL (75 mLs Intravenous Contrast Given 08/04/22 2345)  oxyCODONE-acetaminophen (PERCOCET/ROXICET) 5-325 MG per tablet 2 tablet (2 tablets Oral Given 08/05/22 0118)  clindamycin (CLEOCIN) capsule 150 mg (150 mg Oral Given 08/05/22 0345)    ED Course/ Medical Decision Making/ A&P                           Medical Decision Making Risk Prescription drug management.   Facial swelling consistent with facial  cellulitis, rule out abscess.  I have reviewed and interpreted her laboratory test, and my interpretation is mild elevation of random glucose consistent with known history of diabetes.  CT of maxillofacial bones with contrast shows inflammatory stranding involving the left side of the face suggesting cellulitis without any discrete abscess.  I have independently viewed the images, and agree with the radiologist's interpretation.  I have ordered a dose of clindamycin and I am discharging her home with a prescription for clindamycin.  I have also ordered a prescription for a small number of hydrocodone-acetaminophen tablets to use as needed for severe pain.  She will need close follow-up with her primary care provider.  Strict return precautions were discussed.  Final Clinical Impression(s) / ED Diagnoses Final diagnoses:  Facial cellulitis  Elevated blood pressure reading without diagnosis of hypertension    Rx / DC Orders ED Discharge Orders          Ordered    clindamycin (CLEOCIN) 150 MG capsule  Every 6 hours        08/05/22 0344    HYDROcodone-acetaminophen (NORCO) 5-325 MG tablet  Every 4 hours PRN        08/05/22 0346  Dione Booze, MD 08/05/22 [phone]

## 2022-08-05 NOTE — ED Notes (Signed)
Pt able to understand discharge instructions with minimal questions about medication. Pt discharged to lobby

## 2022-08-05 NOTE — Discharge Instructions (Addendum)
Apply warm compresses several times a day.  You may take ibuprofen and/or acetaminophen as needed for pain.  Reserve hydrocodone-acetaminophen for severe pain.  If the swelling seems to be getting worse, or at anytime you start having trouble breathing or swallowing, return to the emergency department.

## 2022-12-14 ENCOUNTER — Encounter (HOSPITAL_COMMUNITY): Payer: Self-pay

## 2022-12-14 ENCOUNTER — Emergency Department (HOSPITAL_COMMUNITY)
Admission: EM | Admit: 2022-12-14 | Discharge: 2022-12-14 | Discharge status: Against Medical Advice | Payer: Medicare PPO | Attending: Emergency Medicine | Admitting: Emergency Medicine

## 2022-12-14 ENCOUNTER — Other Ambulatory Visit: Payer: Self-pay

## 2022-12-14 DIAGNOSIS — Z5321 Procedure and treatment not carried out due to patient leaving prior to being seen by health care provider: Secondary | ICD-10-CM | POA: Diagnosis not present

## 2022-12-14 DIAGNOSIS — L02415 Cutaneous abscess of right lower limb: Secondary | ICD-10-CM | POA: Insufficient documentation

## 2022-12-14 DIAGNOSIS — N764 Abscess of vulva: Secondary | ICD-10-CM | POA: Insufficient documentation

## 2022-12-14 NOTE — ED Triage Notes (Signed)
Pt came in via POV d/t an abscess between her Rt leg & labia. Pt rates pain 10/10 & stated it is the size of a cantaloupe & has been there for 8 days.

## 2022-12-14 NOTE — ED Notes (Signed)
Pt called 3x in the lobby, no answer. Consulting civil engineer notified.

## 2023-02-18 IMAGING — US US RENAL
1 series · 13 of 25 positions shown · non-contrast
Comparison: Previous studies including abdominal sonogram done on
08/11/[address] done on 06/19/2018

CLINICAL DATA: Hydronephrosis, urinary urgency, increased urinary
frequency

EXAM:
RENAL / URINARY TRACT ULTRASOUND COMPLETE

[Series 1: us renal · 0.22mm/px · 13 of 53 slices shown]
[im 1/53]
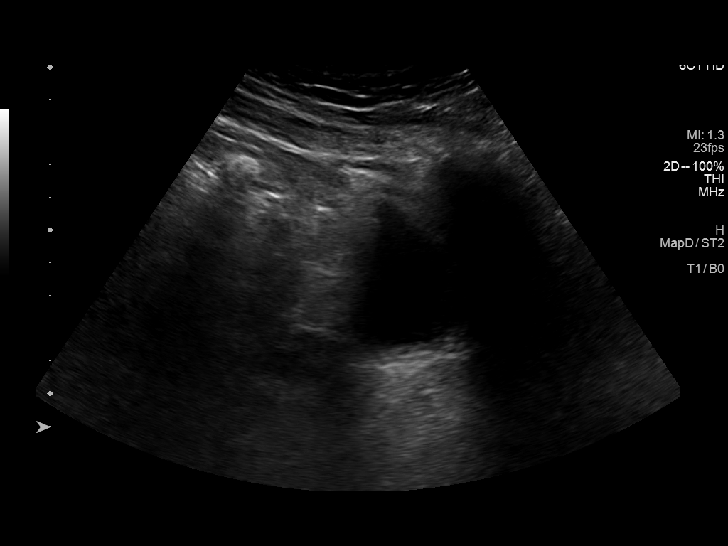
[im 5/53]
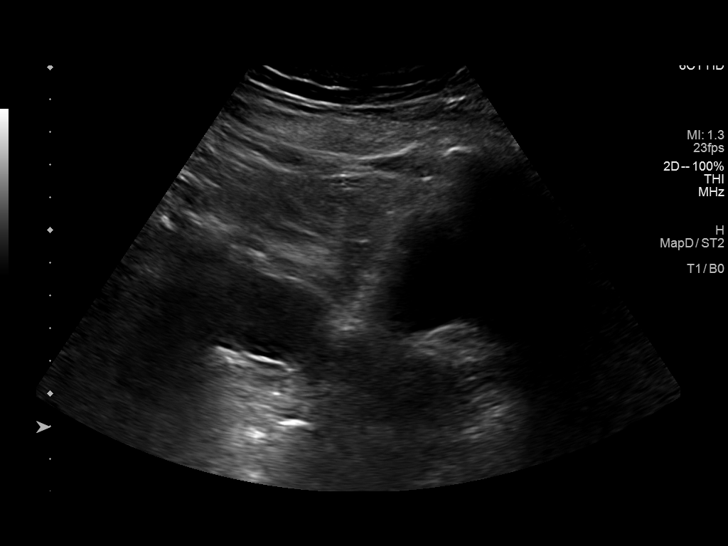
[im 9/53]
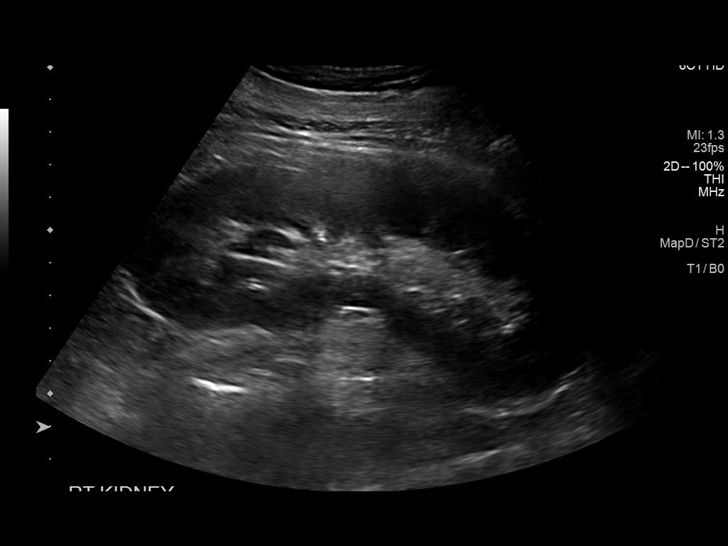
[im 14/53]
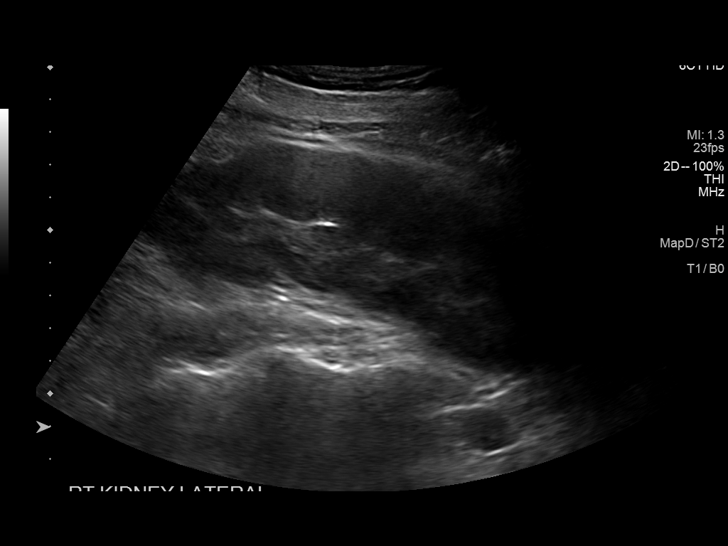
[im 18/53]
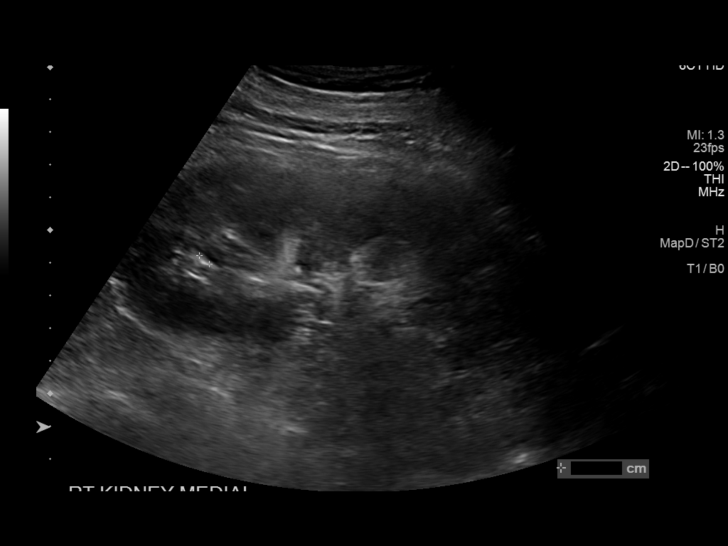
[im 22/53]
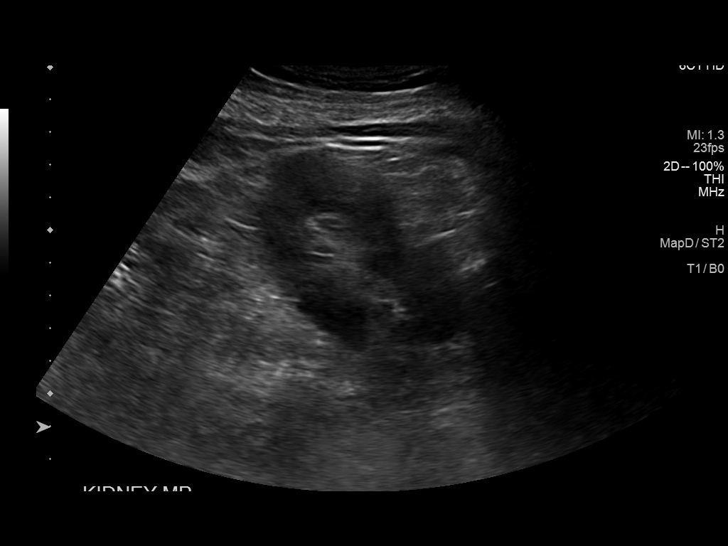
[im 27/53]
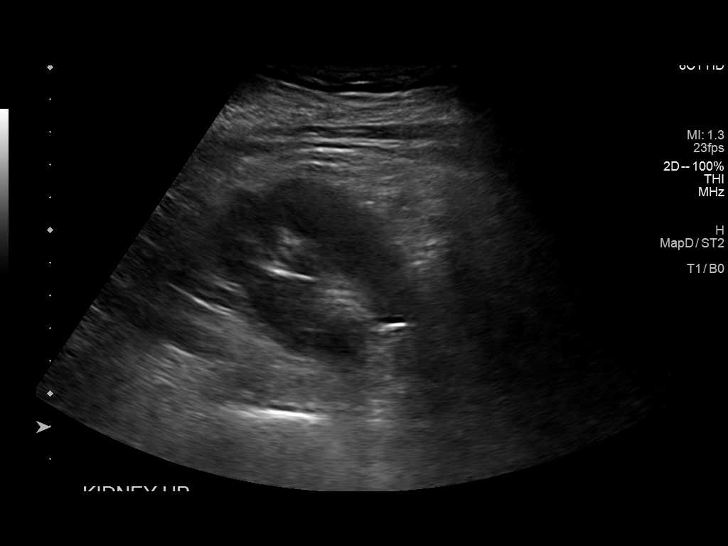
[im 31/53]
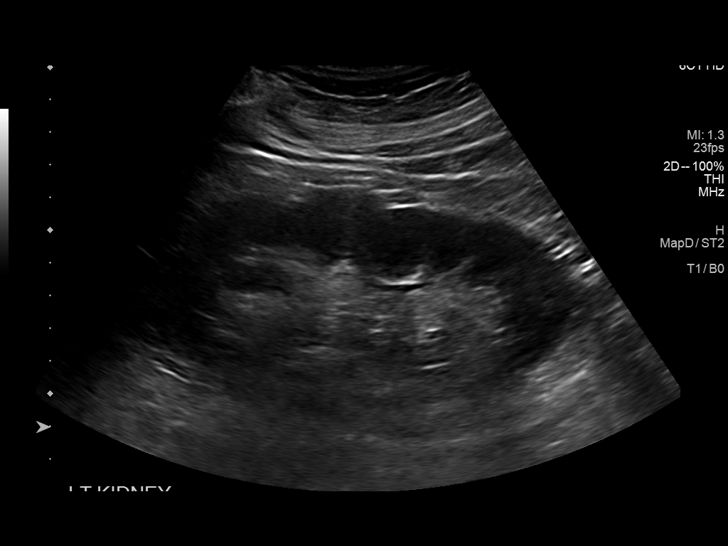
[im 35/53]
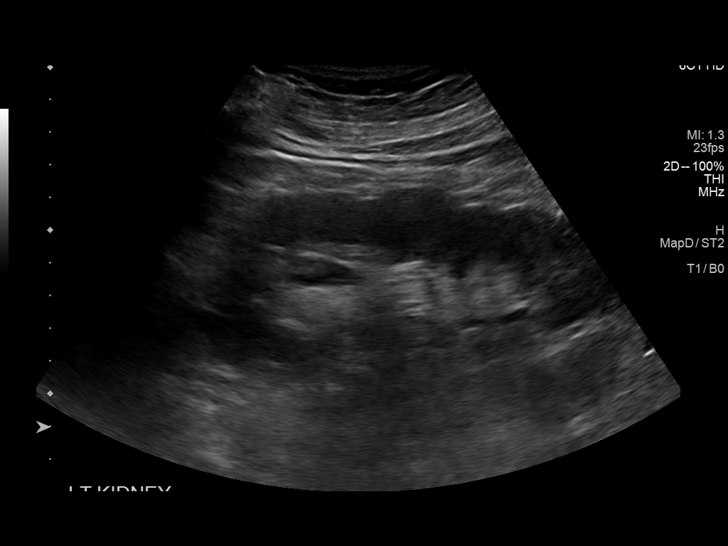
[im 40/53]
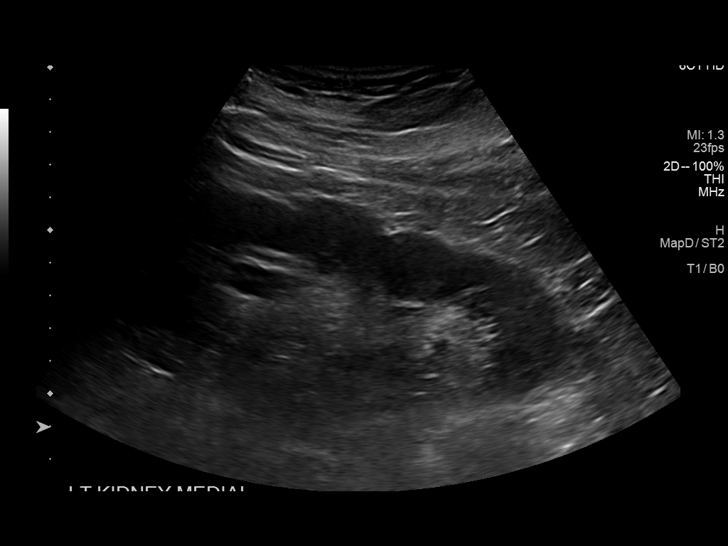
[im 44/53]
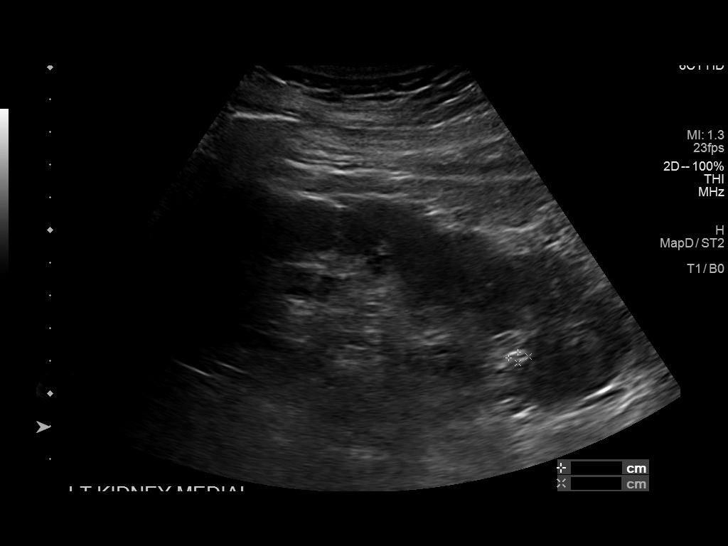
[im 48/53]
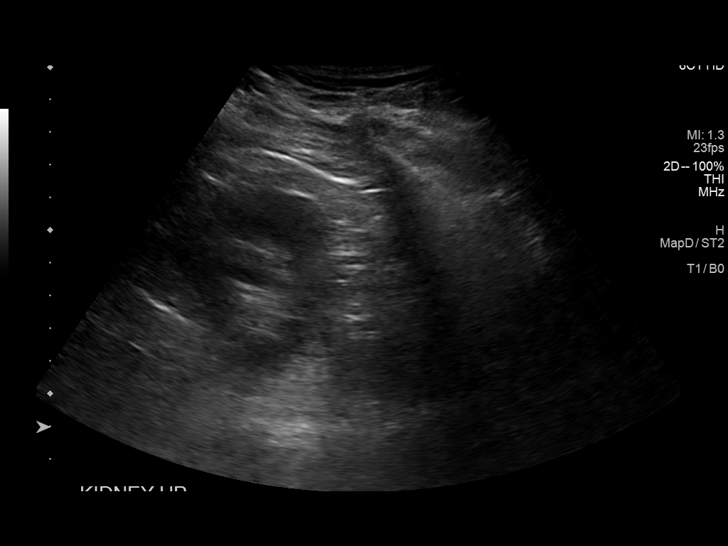
[im 53/53]
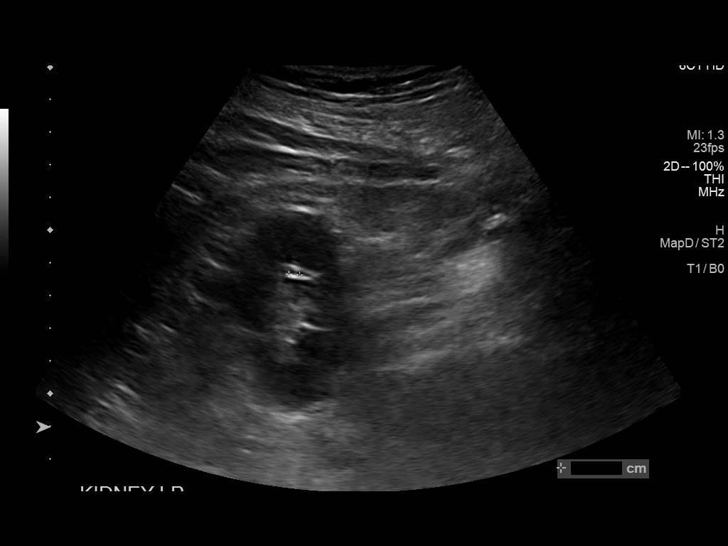

[13 of 25 positions shown; findings below may reference images not displayed]

FINDINGS: Right Kidney:

Renal measurements: 14.8 x 5.3 x 4.9 cm = volume: 201 mL. Is no
hydronephrosis. There is possible 2.3 cm complex cyst in the central
portion of upper pole. There few small linear hyperechoic foci in
the central portion of right kidney, possibly partial volume
averaging artifacts. Less likely possibility would be nonobstructing
renal stones. In the previous CT there was no evidence of cyst or
stones in the right kidney.

Left Kidney:

Renal measurements: 13.6 x 5.4 x 4.3 cm = volume: 165.2 mL. There is
no hydronephrosis. There is possible 1.8 x 1.2 cm cyst in the upper
pole of left kidney. There are few small linear hyperechoic foci in
the central portion of left kidney, possibly partial volume
averaging artifacts. Less likely possibility would be nonobstructing
renal stones. In the previous CT, there was no evidence of renal
stones or cyst in the left kidney.

Bladder:

Appears normal for degree of bladder distention.

Other:

None.
IMPRESSION: There is no hydronephrosis. There are few small scattered linear
hyperechoic foci in the central portions in both kidneys, possibly
partial volume averaging artifacts. Possibility of small
nonobstructing renal stones is not excluded.

There is 2.3 cm mixed echogenic area in the central portion of upper
pole of right kidney. There is 1.8 cm hypoechoic structure in the
upper pole of left kidney. Differential diagnostic possibilities
would include hemorrhagic cysts or artifacts. Follow-up multiphasic
CT may be considered.

## 2023-02-18 IMAGING — DX DG FOOT COMPLETE 3+V*L*
3 series · 3 of 3 positions shown · non-contrast
Comparison: None.

CLINICAL DATA: Chronic left ankle and metatarsal pain.

EXAM:
LEFT FOOT - COMPLETE 3+ VIEW

[dg foot complete left (1 of 3)]
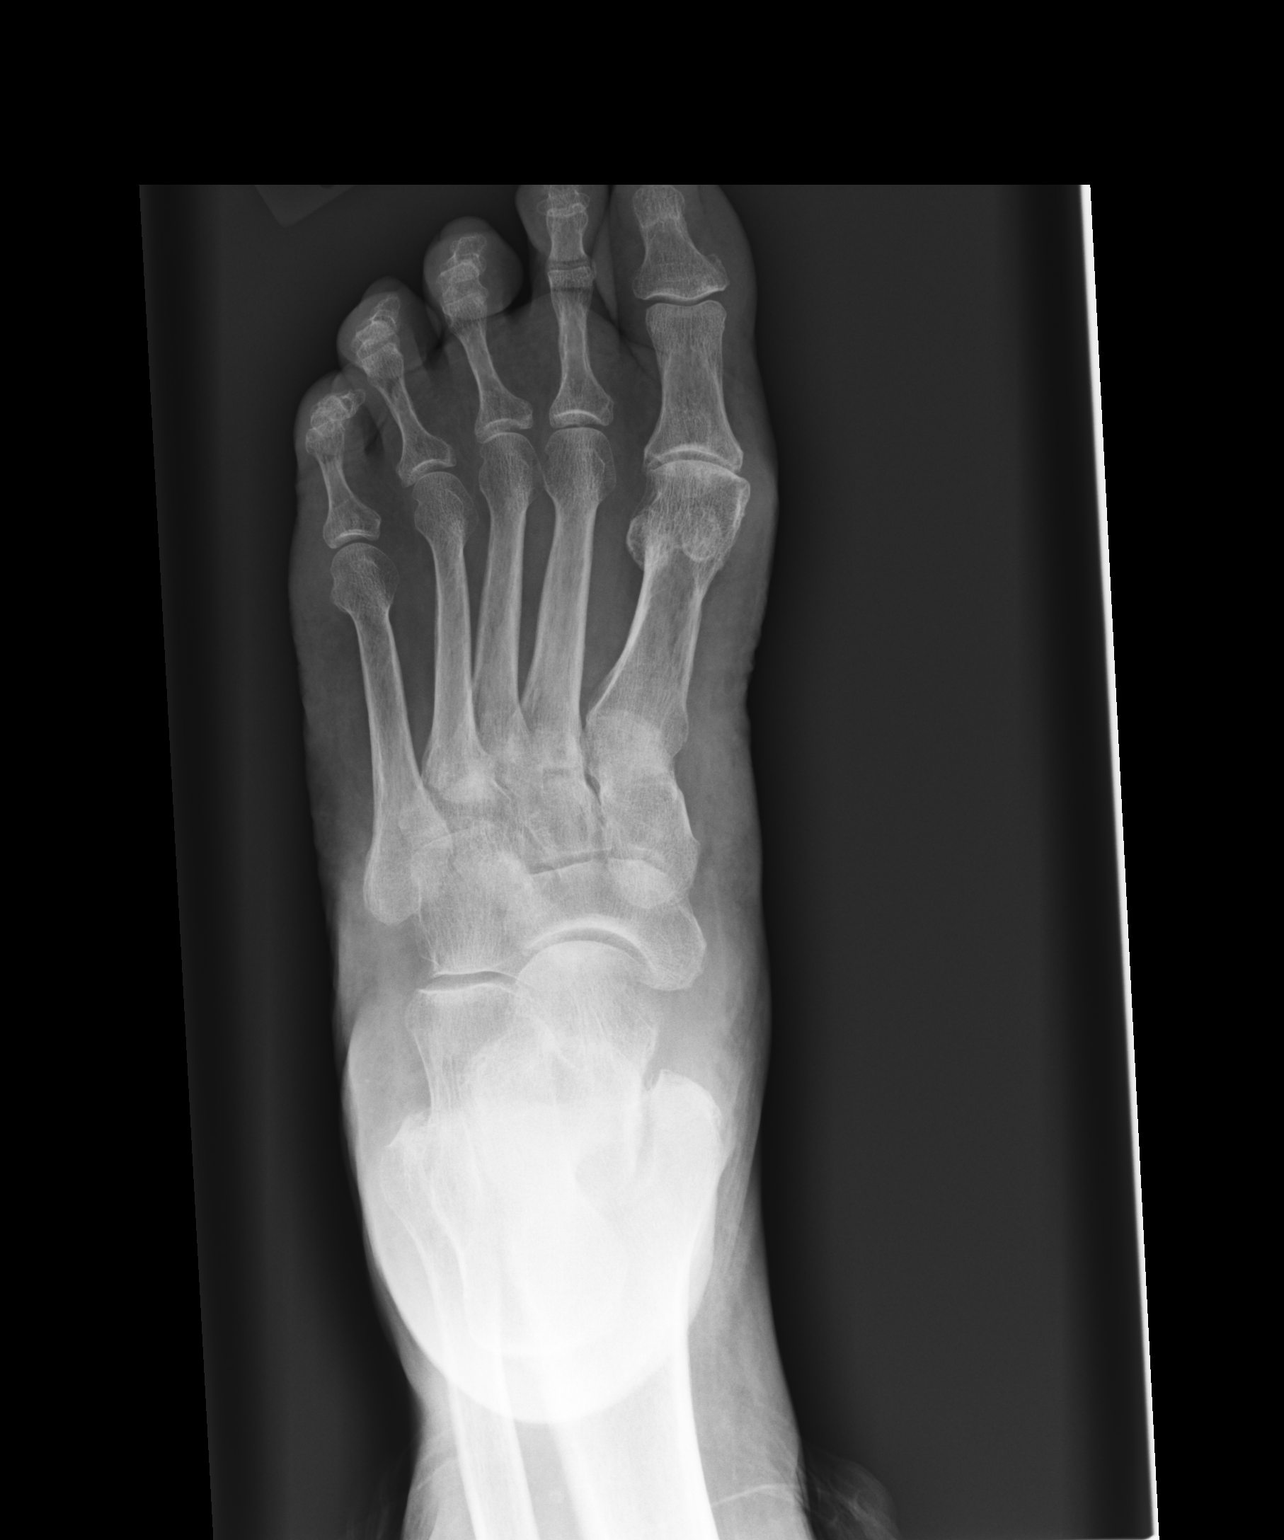

[dg foot complete left (2 of 3)]
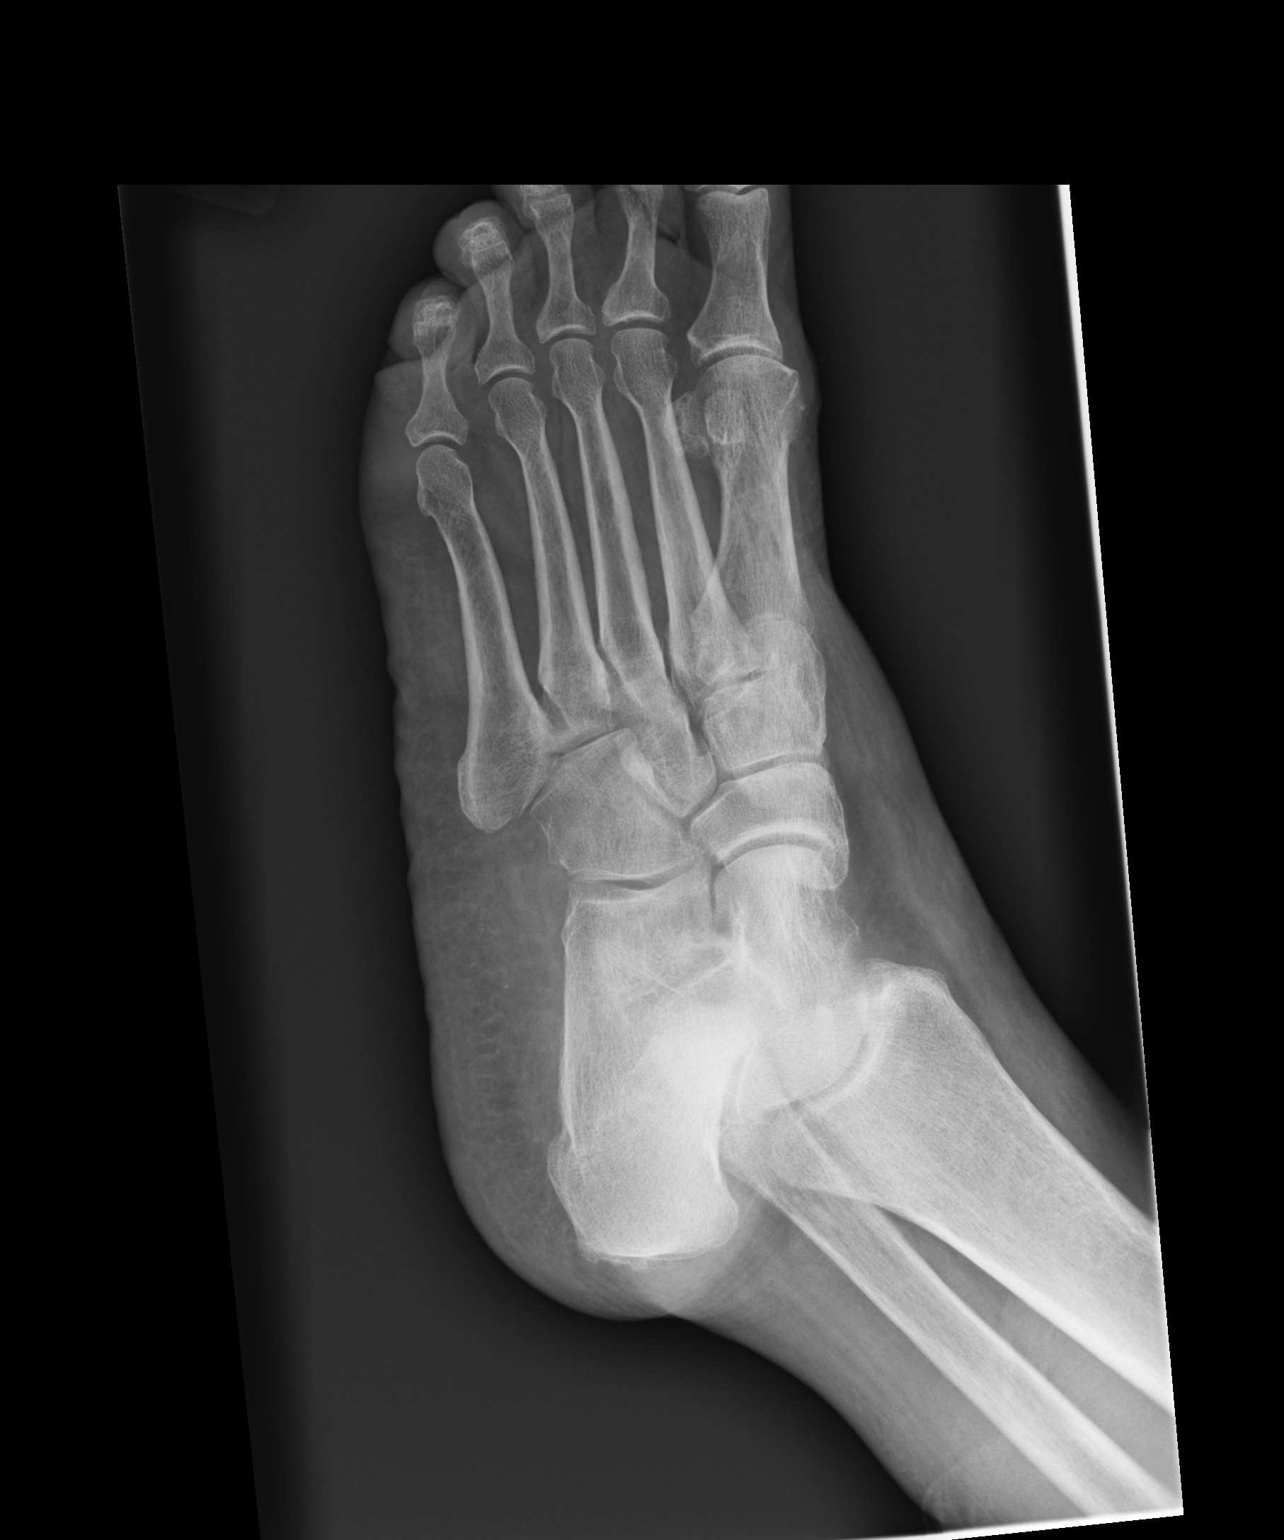

[dg foot complete left (3 of 3)]
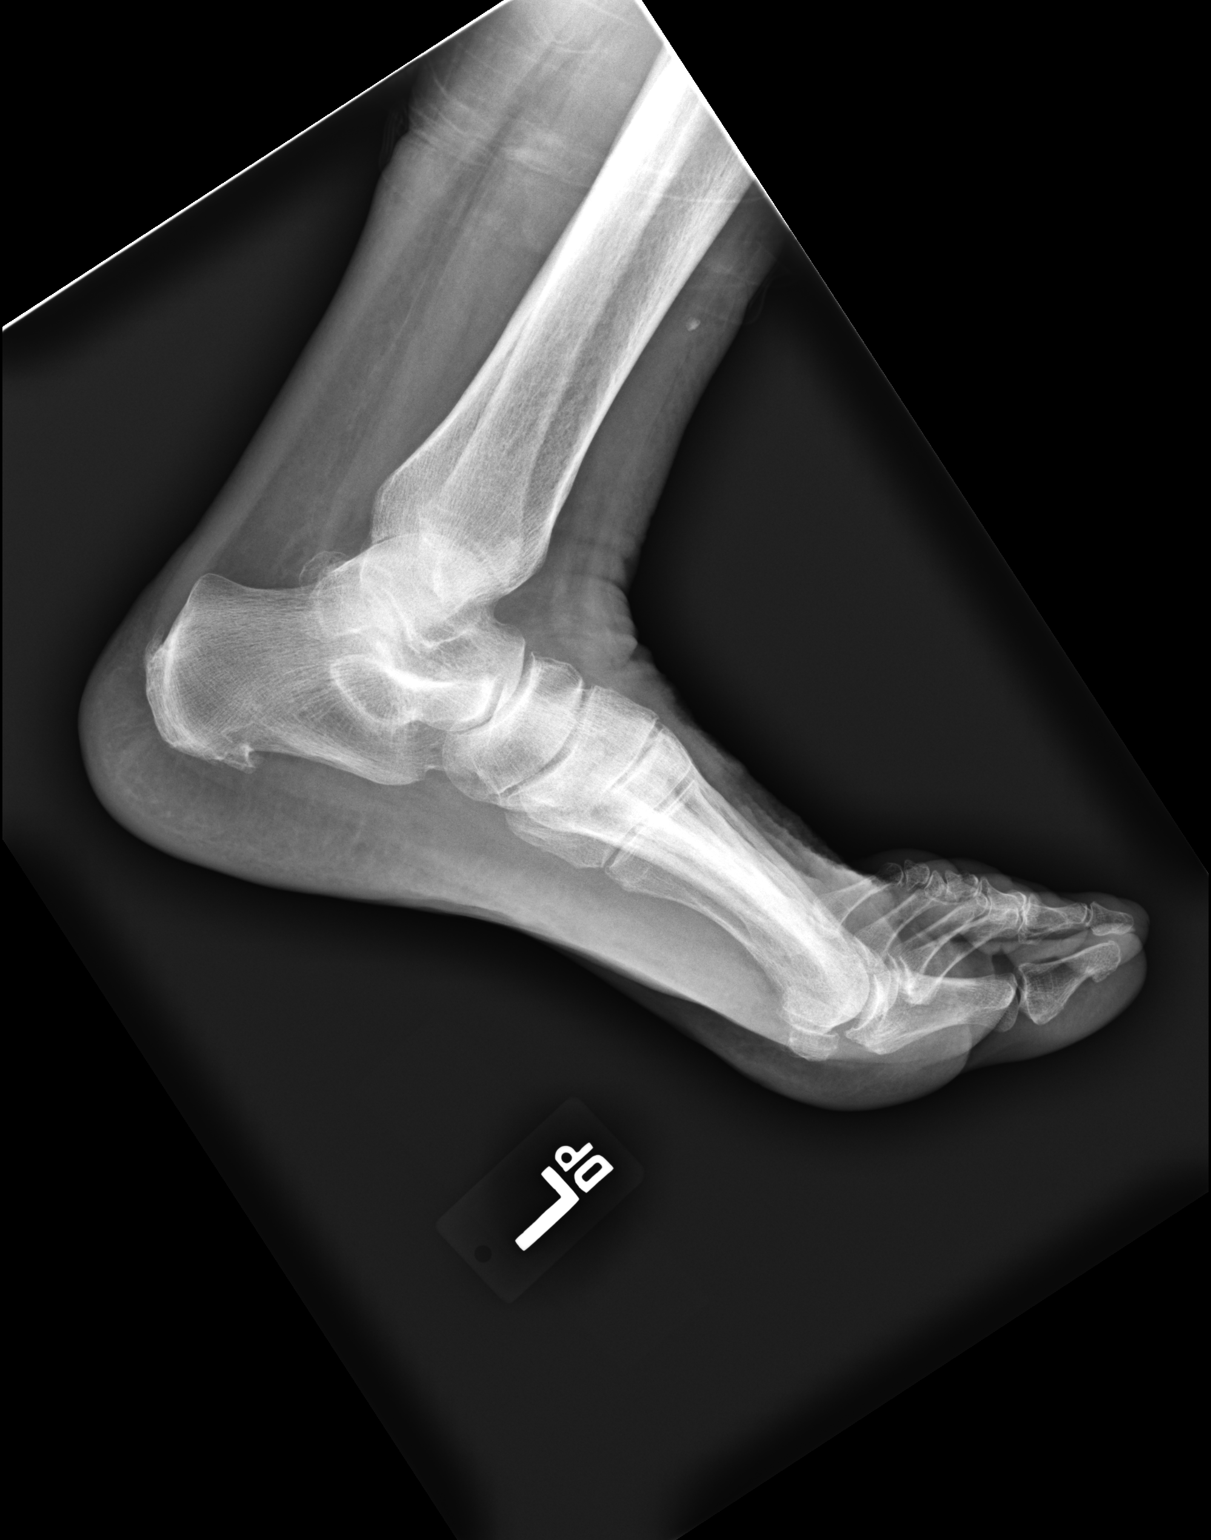

[3 of 3 positions shown; findings below may reference images not displayed]

FINDINGS: No fracture or dislocation is seen. Degenerative changes are noted
in the first metatarsophalangeal joint with bony spurs and joint
space narrowing. Small plantar spur is seen in the calcaneus.
Minimal bony spurs seen in the dorsal aspect of talonavicular joint.
Linear calcification at the attachment of Achilles tendon to the
calcaneus may suggest calcific tendinosis.
IMPRESSION: No fracture or dislocation is seen. Degenerative changes are noted
in first metatarsophalangeal joint and talonavicular joint. Plantar
spur is seen in the calcaneus. Possible calcific tendinosis is seen
at the attachment of Achilles tendon to the calcaneus.
